# Patient Record
Sex: Female | Born: 1935
Health system: Southern US, Community
[De-identification: ages and names within clinical notes are randomized; demographics above are authoritative.]

## PROBLEM LIST (undated history)

## (undated) DIAGNOSIS — E7800 Pure hypercholesterolemia, unspecified: Secondary | ICD-10-CM

## (undated) DIAGNOSIS — R002 Palpitations: Secondary | ICD-10-CM

## (undated) DIAGNOSIS — I10 Essential (primary) hypertension: Secondary | ICD-10-CM

## (undated) DIAGNOSIS — M419 Scoliosis, unspecified: Secondary | ICD-10-CM

## (undated) HISTORY — DX: Palpitations: R00.2

## (undated) HISTORY — DX: Scoliosis, unspecified: M41.9

## (undated) HISTORY — DX: Pure hypercholesterolemia, unspecified: E78.00

## (undated) HISTORY — DX: Essential (primary) hypertension: I10

---

## 1997-12-29 ENCOUNTER — Ambulatory Visit (HOSPITAL_COMMUNITY): Admission: RE | Admit: 1997-12-29 | Discharge: 1997-12-29 | Payer: Self-pay | Admitting: Obstetrics and Gynecology

## 1999-03-06 ENCOUNTER — Other Ambulatory Visit: Admission: RE | Admit: 1999-03-06 | Discharge: 1999-03-06 | Payer: Self-pay | Admitting: Obstetrics and Gynecology

## 1999-12-30 ENCOUNTER — Ambulatory Visit (HOSPITAL_COMMUNITY): Admission: RE | Admit: 1999-12-30 | Discharge: 1999-12-30 | Payer: Self-pay | Admitting: Gastroenterology

## 2000-01-15 ENCOUNTER — Encounter: Admission: RE | Admit: 2000-01-15 | Discharge: 2000-01-15 | Payer: Self-pay | Admitting: Obstetrics and Gynecology

## 2000-01-15 ENCOUNTER — Encounter: Payer: Self-pay | Admitting: Obstetrics and Gynecology

## 2000-05-04 ENCOUNTER — Other Ambulatory Visit: Admission: RE | Admit: 2000-05-04 | Discharge: 2000-05-04 | Payer: Self-pay | Admitting: Obstetrics and Gynecology

## 2001-01-18 ENCOUNTER — Encounter: Admission: RE | Admit: 2001-01-18 | Discharge: 2001-01-18 | Payer: Self-pay | Admitting: Cardiology

## 2001-01-18 ENCOUNTER — Encounter: Payer: Self-pay | Admitting: Cardiology

## 2001-09-23 ENCOUNTER — Encounter: Admission: RE | Admit: 2001-09-23 | Discharge: 2001-09-23 | Payer: Self-pay | Admitting: Gastroenterology

## 2001-09-23 ENCOUNTER — Encounter: Payer: Self-pay | Admitting: Gastroenterology

## 2002-01-25 ENCOUNTER — Encounter: Payer: Self-pay | Admitting: Obstetrics and Gynecology

## 2002-01-25 ENCOUNTER — Encounter: Admission: RE | Admit: 2002-01-25 | Discharge: 2002-01-25 | Payer: Self-pay | Admitting: Obstetrics and Gynecology

## 2003-02-08 ENCOUNTER — Encounter: Admission: RE | Admit: 2003-02-08 | Discharge: 2003-02-08 | Payer: Self-pay | Admitting: Obstetrics and Gynecology

## 2003-02-08 ENCOUNTER — Encounter: Payer: Self-pay | Admitting: Obstetrics and Gynecology

## 2004-03-01 ENCOUNTER — Ambulatory Visit (HOSPITAL_COMMUNITY): Admission: RE | Admit: 2004-03-01 | Discharge: 2004-03-01 | Payer: Self-pay | Admitting: Obstetrics and Gynecology

## 2005-03-13 ENCOUNTER — Ambulatory Visit (HOSPITAL_COMMUNITY): Admission: RE | Admit: 2005-03-13 | Discharge: 2005-03-13 | Payer: Self-pay | Admitting: Obstetrics and Gynecology

## 2005-11-10 HISTORY — PX: US ECHOCARDIOGRAPHY: HXRAD669

## 2006-03-18 ENCOUNTER — Ambulatory Visit (HOSPITAL_COMMUNITY): Admission: RE | Admit: 2006-03-18 | Discharge: 2006-03-18 | Payer: Self-pay | Admitting: Cardiology

## 2007-03-24 ENCOUNTER — Ambulatory Visit (HOSPITAL_COMMUNITY): Admission: RE | Admit: 2007-03-24 | Discharge: 2007-03-24 | Payer: Self-pay | Admitting: Cardiology

## 2008-03-30 ENCOUNTER — Ambulatory Visit (HOSPITAL_COMMUNITY): Admission: RE | Admit: 2008-03-30 | Discharge: 2008-03-30 | Payer: Self-pay | Admitting: Cardiology

## 2009-04-03 ENCOUNTER — Ambulatory Visit (HOSPITAL_COMMUNITY): Admission: RE | Admit: 2009-04-03 | Discharge: 2009-04-03 | Payer: Self-pay | Admitting: Cardiology

## 2010-01-22 ENCOUNTER — Ambulatory Visit: Payer: Self-pay | Admitting: Cardiology

## 2010-04-30 ENCOUNTER — Ambulatory Visit (HOSPITAL_COMMUNITY): Admission: RE | Admit: 2010-04-30 | Discharge: 2010-04-30 | Payer: Self-pay | Admitting: Cardiology

## 2010-07-29 ENCOUNTER — Ambulatory Visit (INDEPENDENT_AMBULATORY_CARE_PROVIDER_SITE_OTHER): Payer: Medicare Other | Admitting: Cardiology

## 2010-07-29 DIAGNOSIS — R002 Palpitations: Secondary | ICD-10-CM

## 2010-07-29 DIAGNOSIS — E7800 Pure hypercholesterolemia, unspecified: Secondary | ICD-10-CM

## 2010-07-29 DIAGNOSIS — I119 Hypertensive heart disease without heart failure: Secondary | ICD-10-CM

## 2010-11-08 NOTE — Procedures (Signed)
[place]  Patient:    Catherine Frost, Catherine Frost                       MRN: [id number] Proc. Date: 12/30/99 Adm. Date:  [id number] Attending:  Rich Brave CC:         Clovis Pu. Patty Sermons, M.D.                           Procedure Report  PROCEDURE:  Colonoscopy.  INDICATIONS:  A 77 year old female for colon cancer screening.  FINDINGS:  Normal exam to the terminal ileum.  DESCRIPTION OF PROCEDURE:  The nature, purpose and risks of the procedure had been discussed with the patient who provided written consent.  Sedation was fentanyl 70 mcg and Versed 7 IV with arrhythmias or desaturation.  The Olympus PCF 140L pediatric extended length video colonoscope was advanced without much difficulty to the terminal ileum which had a normal appearance and pullback was then performed.  The quality of the prep was excellent and it is felt that all areas were well seen.  This was a normal examination.  No polyps, cancer, colitis, vascular malformations or diverticular disease were observed.  Retroflexion in the rectum showed some hypertrophied anal papillae.  No biopsies were obtained. The patient tolerated the procedure well and there were no apparent complications.  IMPRESSION:  Normal screening colonoscopy.  PLAN:  Consider a screening flexible sigmoidoscopy in five years with perhaps a repeat screening colonoscopy in ten years if the patient remains in good general medical health.  Annual Hemoccult testing probably not necessary in view of the fact she had a negative colonoscopy. DD:  12/30/99 TD:  12/30/99 Job: [id number]

## 2011-01-29 ENCOUNTER — Encounter: Payer: Self-pay | Admitting: Cardiology

## 2011-02-03 ENCOUNTER — Ambulatory Visit (INDEPENDENT_AMBULATORY_CARE_PROVIDER_SITE_OTHER): Payer: Medicare Other | Admitting: *Deleted

## 2011-02-03 DIAGNOSIS — E7800 Pure hypercholesterolemia, unspecified: Secondary | ICD-10-CM

## 2011-02-03 LAB — HEPATIC FUNCTION PANEL
ALT: 19 U/L (ref 0–35)
AST: 35 U/L (ref 0–37)
Alkaline Phosphatase: 63 U/L (ref 39–117)
Total Bilirubin: 0.4 mg/dL (ref 0.3–1.2)
Total Protein: 7 g/dL (ref 6.0–8.3)

## 2011-02-03 LAB — LIPID PANEL
Cholesterol: 207 mg/dL — ABNORMAL HIGH (ref 0–200)
HDL: 79.1 mg/dL (ref >39.00)
Total CHOL/HDL Ratio: 3
VLDL: 13.8 mg/dL (ref 0.0–40.0)

## 2011-02-03 LAB — BASIC METABOLIC PANEL
Chloride: 103 mEq/L (ref 96–112)
GFR: 74.25 mL/min (ref >60.00)
Glucose, Bld: 90 mg/dL (ref 70–99)

## 2011-02-06 ENCOUNTER — Ambulatory Visit (INDEPENDENT_AMBULATORY_CARE_PROVIDER_SITE_OTHER): Payer: Medicare Other | Admitting: Cardiology

## 2011-02-06 ENCOUNTER — Encounter: Payer: Self-pay | Admitting: Cardiology

## 2011-02-06 VITALS — BP 140/85 | HR 74 | Wt 142.0 lb

## 2011-02-06 DIAGNOSIS — E7800 Pure hypercholesterolemia, unspecified: Secondary | ICD-10-CM

## 2011-02-06 DIAGNOSIS — M4120 Other idiopathic scoliosis, site unspecified: Secondary | ICD-10-CM

## 2011-02-06 DIAGNOSIS — R002 Palpitations: Secondary | ICD-10-CM

## 2011-02-06 DIAGNOSIS — M419 Scoliosis, unspecified: Secondary | ICD-10-CM | POA: Insufficient documentation

## 2011-02-06 DIAGNOSIS — I119 Hypertensive heart disease without heart failure: Secondary | ICD-10-CM

## 2011-02-06 DIAGNOSIS — E785 Hyperlipidemia, unspecified: Secondary | ICD-10-CM

## 2011-02-06 NOTE — Progress Notes (Signed)
Catherine Frost Date of Birth:  04-11-1928 [facility] / [facility] [address]   [address] [place], [place]  [address] [phone]           Fax   [phone]  History of Present Illness: This pleasant 77 year old woman is seen for a scheduled followup office visit.  She has a past history of essential hypertension.  She also has a history of hypercholesterolemia.  She has a history of severe scoliosis.  Since last visit she has been feeling well.  She's been experiencing any chest pain or shortness of breath.  Last week she was walking in her neighborhood and got stung by an insect behind her right knee.  She applied some steroid cream to the bite mark and after that had some transient chest tightness which resolved when she took an antihistamine.  She's had occasional brief palpitations but no sustained arrhythmia.  She's not having any exertional chest pain to suggest angina pectoris.  Current Outpatient Prescriptions  Medication Sig Dispense Refill  . aspirin 81 MG tablet Take 81 mg by mouth daily.        . metoprolol (LOPRESSOR) 50 MG tablet Take 50 mg by mouth. Takes 1/2 Tab Daily       . Multiple Vitamins-Minerals (OCUVITE PO) Take by mouth daily.        . Thiamine HCl (VITAMIN B-1) 50 MG tablet Take 50 mg by mouth daily.          Allergies  Allergen Reactions  . Codeine   . Lipitor (Atorvastatin Calcium)     Fatigue  . Novocain     There is no problem list on file for this patient.   History  Smoking status  . Never Smoker   Smokeless tobacco  . Not on file    History  Alcohol Use No    Family History  Problem Relation Age of Onset  . Heart disease Mother   . Coronary artery disease Mother   . Parkinsonism Father     Review of Systems: Constitutional: no fever chills diaphoresis or fatigue or change in weight.  Head and neck: no hearing loss, no epistaxis, no photophobia or visual disturbance. Respiratory: No cough, shortness of breath or  wheezing. Cardiovascular: No chest pain peripheral edema, palpitations. Gastrointestinal: No abdominal distention, no abdominal pain, no change in bowel habits hematochezia or melena. Genitourinary: No dysuria, no frequency, no urgency, no nocturia. Musculoskeletal:No arthralgias, no back pain, no gait disturbance or myalgias. Neurological: No dizziness, no headaches, no numbness, no seizures, no syncope, no weakness, no tremors. Hematologic: No lymphadenopathy, no easy bruising. Psychiatric: No confusion, no hallucinations, no sleep disturbance.    Physical Exam: Filed Vitals:   02/06/11 1419  BP: 140/85  Pulse: 74  The general appearance reveals a well-developed well-nourished woman in no distress.Pupils equal and reactive.   Extraocular Movements are full.  There is no scleral icterus.  The mouth and pharynx are normal.  The neck is supple.  The carotids reveal no bruits.  The jugular venous pressure is normal.  The thyroid is not enlarged.  There is no lymphadenopathy.  The chest is clear to percussion and auscultation. There are no rales or rhonchi. Expansion of the chest is symmetrical.    Spine reveals severe scoliosis.The precordium is quiet.  The first heart sound is normal.  The second heart sound is physiologically split.  There is no murmur gallop rub or click.  There is no abnormal lift or heave.  The abdomen is soft and nontender. Bowel sounds are normal. The liver and spleen are not enlarged. There Are no abdominal masses. There are no bruits.  The pedal pulses are good.  There is no phlebitis or edema.  There is no cyanosis or clubbing.  Strength is normal and symmetrical in all extremities.  There is no lateralizing weakness.  There are no sensory deficits.  The skin is warm and dry.  There is no rash.     Assessment / Plan:  Continue same medication.  Work harder on weight loss and strict diet.  Recheck in 6 months for followup office visit and fasting lab  work

## 2011-03-26 ENCOUNTER — Other Ambulatory Visit: Payer: Self-pay | Admitting: Cardiology

## 2011-03-26 DIAGNOSIS — Z1231 Encounter for screening mammogram for malignant neoplasm of breast: Secondary | ICD-10-CM

## 2011-05-02 ENCOUNTER — Ambulatory Visit (HOSPITAL_COMMUNITY)
Admission: RE | Admit: 2011-05-02 | Discharge: 2011-05-02 | Disposition: A | Discharge status: Home / Self Care | Payer: Medicare Other | Source: Ambulatory Visit | Attending: Cardiology | Admitting: Cardiology

## 2011-05-02 DIAGNOSIS — Z1231 Encounter for screening mammogram for malignant neoplasm of breast: Secondary | ICD-10-CM

## 2011-08-01 ENCOUNTER — Other Ambulatory Visit (INDEPENDENT_AMBULATORY_CARE_PROVIDER_SITE_OTHER): Payer: Medicare Other | Admitting: *Deleted

## 2011-08-01 DIAGNOSIS — E785 Hyperlipidemia, unspecified: Secondary | ICD-10-CM | POA: Diagnosis not present

## 2011-08-01 LAB — BASIC METABOLIC PANEL
CO2: 27 mEq/L (ref 19–32)
Creatinine, Ser: 0.8 mg/dL (ref 0.4–1.2)
Glucose, Bld: 88 mg/dL (ref 70–99)
Sodium: 139 mEq/L (ref 135–145)

## 2011-08-01 LAB — HEPATIC FUNCTION PANEL
ALT: 24 U/L (ref 0–35)
Alkaline Phosphatase: 66 U/L (ref 39–117)
Total Protein: 6.8 g/dL (ref 6.0–8.3)

## 2011-08-01 LAB — LIPID PANEL: LDL Cholesterol: 109 mg/dL — ABNORMAL HIGH (ref 0–99)

## 2011-08-01 NOTE — Progress Notes (Signed)
Quick Note:  Please make copy of labs for patient visit. ______ 

## 2011-08-07 ENCOUNTER — Ambulatory Visit (INDEPENDENT_AMBULATORY_CARE_PROVIDER_SITE_OTHER): Payer: Medicare Other | Admitting: Cardiology

## 2011-08-07 ENCOUNTER — Encounter: Payer: Self-pay | Admitting: Cardiology

## 2011-08-07 VITALS — BP 130/80 | HR 80 | Ht 60.0 in | Wt 142.0 lb

## 2011-08-07 DIAGNOSIS — M4120 Other idiopathic scoliosis, site unspecified: Secondary | ICD-10-CM

## 2011-08-07 DIAGNOSIS — E7800 Pure hypercholesterolemia, unspecified: Secondary | ICD-10-CM | POA: Diagnosis not present

## 2011-08-07 DIAGNOSIS — R002 Palpitations: Secondary | ICD-10-CM

## 2011-08-07 DIAGNOSIS — I119 Hypertensive heart disease without heart failure: Secondary | ICD-10-CM | POA: Diagnosis not present

## 2011-08-07 DIAGNOSIS — M419 Scoliosis, unspecified: Secondary | ICD-10-CM

## 2011-08-07 NOTE — Progress Notes (Signed)
Catherine Frost Date of Birth:  [birth year] [facility] [address] [place], [place]  [address] [phone]  Fax   [phone]  HPI: This pleasant 78 year old woman is seen for a scheduled 6 month followup office visit.  She has a history of essential hypertension and history of hypercholesterolemia.  She also has severe scoliosis.  She has not had any symptoms to suggest ischemic heart disease or angina pectoris.  Current Outpatient Prescriptions  Medication Sig Dispense Refill  . aspirin 81 MG tablet Take 81 mg by mouth daily.        . metoprolol (LOPRESSOR) 50 MG tablet Take 50 mg by mouth. Takes 1/2 Tab Daily       . Multiple Vitamins-Minerals (OCUVITE PO) Take by mouth daily.        . Thiamine HCl (VITAMIN B-1) 50 MG tablet Take 50 mg by mouth daily.          Allergies  Allergen Reactions  . Codeine   . Lipitor (Atorvastatin Calcium)     Fatigue  . Novocain     Patient Active Problem List  Diagnoses  . Hypercholesterolemia  . Palpitations  . Benign hypertensive heart disease without heart failure  . Scoliosis    History  Smoking status  . Never Smoker   Smokeless tobacco  . Not on file    History  Alcohol Use No    Family History  Problem Relation Age of Onset  . Heart disease Mother   . Coronary artery disease Mother   . Parkinsonism Father     Review of Systems: The patient denies any heat or cold intolerance.  No weight gain or weight loss.  The patient denies headaches or blurry vision.  There is no cough or sputum production.  The patient denies dizziness.  There is no hematuria or hematochezia.  The patient denies any muscle aches or arthritis.  The patient denies any rash.  The patient denies frequent falling or instability.  There is no history of depression or anxiety.  All other systems were reviewed and are negative.   Physical Exam: Filed Vitals:   08/07/11 1412  BP: 130/80  Pulse: 80   the general appearance  reveals a well-developed well-nourished woman in no distress.  He does have a significant scoliosis.Pupils equal and reactive.   Extraocular Movements are full.  There is no scleral icterus.  The mouth and pharynx are normal.  The neck is supple.  The carotids reveal no bruits.  The jugular venous pressure is normal.  The thyroid is not enlarged.  There is no lymphadenopathy.  The chest is clear to percussion and auscultation. There are no rales or rhonchi. Expansion of the chest is symmetrical.  The precordium is quiet.  The first heart sound is normal.  The second heart sound is physiologically split.  There is no murmur gallop rub or click.  There is no abnormal lift or heave.  The abdomen is soft and nontender. Bowel sounds are normal. The liver and spleen are not enlarged. There Are no abdominal masses. There are no bruits.  The pedal pulses are good.  There is no phlebitis or edema.  There is no cyanosis or clubbing. Strength is normal and symmetrical in all extremities.  There is no lateralizing weakness.  There are no sensory deficits.      Assessment / Plan:  She is to continue same medication.  Her blood work this time was quite satisfactory.  Recheck in 6  months for followup office visit and fasting lab work.  Continue disciplined exercise program.

## 2011-08-07 NOTE — Patient Instructions (Signed)
Your physician recommends that you continue on your current medications as directed. Please refer to the Current Medication list given to you today.  Your physician wants you to follow-up in: 6 months. You will receive a reminder letter in the mail two months in advance. If you don't receive a letter, please call our office to schedule the follow-up appointment.  

## 2011-08-07 NOTE — Assessment & Plan Note (Signed)
She has not been aware of any recent palpitations or skipping of her heart.  No chest pain or shortness of breath.

## 2011-08-07 NOTE — Assessment & Plan Note (Signed)
The patient has started a regular exercise program.  She doesn't hour of exercise in her home each day.  Half of it is treadmill and half visit as a stationary bike.  She has felt much better since she started the exercise program.

## 2011-08-07 NOTE — Assessment & Plan Note (Signed)
Patient has a history of essential hypertension which has responded to metoprolol.  She had a recent colonoscopy which was uneventful.

## 2011-10-09 ENCOUNTER — Other Ambulatory Visit: Payer: Self-pay | Admitting: Cardiology

## 2011-10-10 NOTE — Telephone Encounter (Signed)
Refilled metoprolol 

## 2011-11-28 DIAGNOSIS — H52209 Unspecified astigmatism, unspecified eye: Secondary | ICD-10-CM | POA: Diagnosis not present

## 2011-11-28 DIAGNOSIS — H01009 Unspecified blepharitis unspecified eye, unspecified eyelid: Secondary | ICD-10-CM | POA: Diagnosis not present

## 2011-11-28 DIAGNOSIS — H2510 Age-related nuclear cataract, unspecified eye: Secondary | ICD-10-CM | POA: Diagnosis not present

## 2011-11-28 DIAGNOSIS — H43819 Vitreous degeneration, unspecified eye: Secondary | ICD-10-CM | POA: Diagnosis not present

## 2012-03-08 ENCOUNTER — Telehealth: Payer: Self-pay | Admitting: Cardiology

## 2012-03-08 DIAGNOSIS — E7800 Pure hypercholesterolemia, unspecified: Secondary | ICD-10-CM

## 2012-03-08 NOTE — Telephone Encounter (Signed)
F/U  Calling  Back to R.S appt, need to be set up for lab work prior to appt on  10/5

## 2012-03-08 NOTE — Telephone Encounter (Signed)
Left message to call back   [facility] for labs (bmet,hfp,lp) to be done prior to visit, will schedule when calls back

## 2012-03-08 NOTE — Telephone Encounter (Signed)
plz return call to patient at hm# regarding lab order before 11/5 appnt.

## 2012-03-20 DIAGNOSIS — Z23 Encounter for immunization: Secondary | ICD-10-CM | POA: Diagnosis not present

## 2012-03-23 ENCOUNTER — Other Ambulatory Visit: Payer: Self-pay | Admitting: *Deleted

## 2012-03-23 DIAGNOSIS — I119 Hypertensive heart disease without heart failure: Secondary | ICD-10-CM

## 2012-03-23 DIAGNOSIS — E7800 Pure hypercholesterolemia, unspecified: Secondary | ICD-10-CM

## 2012-03-23 DIAGNOSIS — R002 Palpitations: Secondary | ICD-10-CM

## 2012-03-24 ENCOUNTER — Other Ambulatory Visit (INDEPENDENT_AMBULATORY_CARE_PROVIDER_SITE_OTHER): Payer: Medicare Other

## 2012-03-24 DIAGNOSIS — R002 Palpitations: Secondary | ICD-10-CM

## 2012-03-24 DIAGNOSIS — E7800 Pure hypercholesterolemia, unspecified: Secondary | ICD-10-CM

## 2012-03-24 DIAGNOSIS — I119 Hypertensive heart disease without heart failure: Secondary | ICD-10-CM

## 2012-03-24 LAB — LIPID PANEL
Total CHOL/HDL Ratio: 3
Triglycerides: 76 mg/dL (ref 0.0–149.0)
VLDL: 15.2 mg/dL (ref 0.0–40.0)

## 2012-03-24 LAB — HEPATIC FUNCTION PANEL
AST: 36 U/L (ref 0–37)
Albumin: 4.1 g/dL (ref 3.5–5.2)
Alkaline Phosphatase: 66 U/L (ref 39–117)
Bilirubin, Direct: 0.1 mg/dL (ref 0.0–0.3)
Total Bilirubin: 0.7 mg/dL (ref 0.3–1.2)
Total Protein: 7.2 g/dL (ref 6.0–8.3)

## 2012-03-24 LAB — BASIC METABOLIC PANEL
Creatinine, Ser: 0.8 mg/dL (ref 0.4–1.2)
GFR: 71.93 mL/min (ref >60.00)
Glucose, Bld: 100 mg/dL — ABNORMAL HIGH (ref 70–99)
Potassium: 4.4 mEq/L (ref 3.5–5.1)
Sodium: 141 mEq/L (ref 135–145)

## 2012-03-24 NOTE — Progress Notes (Signed)
Quick Note:  Please make copy of labs for patient visit. ______ 

## 2012-03-25 ENCOUNTER — Encounter: Payer: Self-pay | Admitting: Cardiology

## 2012-03-25 ENCOUNTER — Ambulatory Visit (INDEPENDENT_AMBULATORY_CARE_PROVIDER_SITE_OTHER): Payer: Medicare Other | Admitting: Cardiology

## 2012-03-25 VITALS — BP 156/84 | HR 73 | Ht 60.0 in | Wt 136.1 lb

## 2012-03-25 DIAGNOSIS — R002 Palpitations: Secondary | ICD-10-CM

## 2012-03-25 DIAGNOSIS — I119 Hypertensive heart disease without heart failure: Secondary | ICD-10-CM

## 2012-03-25 DIAGNOSIS — E7800 Pure hypercholesterolemia, unspecified: Secondary | ICD-10-CM

## 2012-03-25 DIAGNOSIS — M419 Scoliosis, unspecified: Secondary | ICD-10-CM

## 2012-03-25 DIAGNOSIS — M4120 Other idiopathic scoliosis, site unspecified: Secondary | ICD-10-CM

## 2012-03-25 NOTE — Patient Instructions (Addendum)
Continue same medications   Your physician wants you to follow-up in: 6 months with fasting lab work. You will receive a reminder letter in the mail two months in advance. If you don't receive a letter, please call our office to schedule the follow-up appointment.

## 2012-03-25 NOTE — Progress Notes (Signed)
Catherine Frost Date of Birth:  [birth year] Catherine Frost [facility] [address] [place], [place]  [address] [phone]  Fax   [phone]  HPI: This pleasant 78 year old woman is seen for a scheduled 6 month followup office visit. She has a history of essential hypertension and history of hypercholesterolemia. She also has severe scoliosis. She has not had any symptoms to suggest ischemic heart disease or angina pectoris.  Since her last saw her her husband was found to have metastatic cancer and died about one month ago.  The patient has been more tense and anxious during the grief process and has adversely affected her blood pressure.  Current Outpatient Prescriptions  Medication Sig Dispense Refill  . aspirin 81 MG tablet Take 81 mg by mouth daily.        . metoprolol (LOPRESSOR) 50 MG tablet TAKE 1/2 A TABLET BY MOUTH ONCE DAILY  90 tablet  PRN  . Thiamine HCl (VITAMIN B-1) 50 MG tablet Take 50 mg by mouth daily.          Allergies  Allergen Reactions  . Codeine   . Lipitor (Atorvastatin Calcium)     Fatigue  . Procaine Hcl     Patient Active Problem List  Diagnosis  . Hypercholesterolemia  . Palpitations  . Benign hypertensive heart disease without heart failure  . Scoliosis    History  Smoking status  . Never Smoker   Smokeless tobacco  . Not on file    History  Alcohol Use No    Family History  Problem Relation Age of Onset  . Heart disease Mother   . Coronary artery disease Mother   . Parkinsonism Father     Review of Systems: The patient denies any heat or cold intolerance.  No weight gain or weight loss.  The patient denies headaches or blurry vision.  There is no cough or sputum production.  The patient denies dizziness.  There is no hematuria or hematochezia.  The patient denies any muscle aches or arthritis.  The patient denies any rash.  The patient denies frequent falling or instability.  There is no history of depression or anxiety.   All other systems were reviewed and are negative.   Physical Exam: Filed Vitals:   03/25/12 1509  BP: 156/84  Pulse:    the general appearance reveals a well-developed woman in no distress.  He is very marked spinal and torso deformity because of her scoliosis.Pupils equal and reactive.   Extraocular Movements are full.  There is no scleral icterus.  The mouth and pharynx are normal.  The neck is supple.  The carotids reveal no bruits.  The jugular venous pressure is normal.  The thyroid is not enlarged.  There is no lymphadenopathy.  The chest is clear to percussion and auscultation. There are no rales or rhonchi. Expansion of the chest is symmetrical.  The precordium is quiet.  The first heart sound is normal.  The second heart sound is physiologically split.  There is no murmur gallop rub or click.  There is no abnormal lift or heave.  The abdomen is soft and nontender. Bowel sounds are normal. The liver and spleen are not enlarged. There Are no abdominal masses. There are no bruits.  The pedal pulses are good.  There is no phlebitis or edema.  There is no cyanosis or clubbing. Strength is normal and symmetrical in all extremities.  There is no lateralizing weakness.  There are no sensory deficits.  The  skin is warm and dry.  There is no rash.     Assessment / Plan: Continue same medication.  Her blood pressure is high today but should improve with more time following her husband's death.  Her husband was a retired Government social research officer here in [place].   Return here in 6 months for followup office visit lipid panel hepatic function panel and basal metabolic panel

## 2012-03-25 NOTE — Assessment & Plan Note (Signed)
The patient has had severe scoliosis since being a young girl.  Her spine is so crooked that her rib cage tenderness to want to crowd out her pelvic bone.  She is asking for some type of a back brace which might help her.  I told her that I would like her to see an orthopedist with experience with scoliosis and we will have her call Dr. Darrelyn Hillock.  I also filled out a form for her to have handicapped parking

## 2012-03-25 NOTE — Assessment & Plan Note (Signed)
The patient has not been experiencing any severe palpitations.  She remains on metoprolol 25 mg daily

## 2012-03-25 NOTE — Assessment & Plan Note (Signed)
While her husband was ill in the hospital the patient's diet was more chaotic and she has not been on her careful diet.Marland Kitchen despite this, her lipid values are not much changed from previously.  The patient has lost 4 pounds since we last saw her.

## 2012-04-15 ENCOUNTER — Other Ambulatory Visit: Payer: Self-pay | Admitting: Cardiology

## 2012-04-15 DIAGNOSIS — Z1231 Encounter for screening mammogram for malignant neoplasm of breast: Secondary | ICD-10-CM

## 2012-04-27 ENCOUNTER — Ambulatory Visit: Payer: Medicare Other | Admitting: Cardiology

## 2012-05-04 ENCOUNTER — Ambulatory Visit (HOSPITAL_COMMUNITY)
Admission: RE | Admit: 2012-05-04 | Discharge: 2012-05-04 | Disposition: A | Discharge status: Home / Self Care | Payer: Medicare Other | Source: Ambulatory Visit | Attending: Cardiology | Admitting: Cardiology

## 2012-05-04 DIAGNOSIS — Z1231 Encounter for screening mammogram for malignant neoplasm of breast: Secondary | ICD-10-CM | POA: Insufficient documentation

## 2012-05-25 DIAGNOSIS — M4120 Other idiopathic scoliosis, site unspecified: Secondary | ICD-10-CM | POA: Diagnosis not present

## 2012-06-01 DIAGNOSIS — M4120 Other idiopathic scoliosis, site unspecified: Secondary | ICD-10-CM | POA: Diagnosis not present

## 2012-06-07 DIAGNOSIS — M4120 Other idiopathic scoliosis, site unspecified: Secondary | ICD-10-CM | POA: Diagnosis not present

## 2012-10-20 ENCOUNTER — Other Ambulatory Visit: Payer: Medicare Other

## 2012-10-22 ENCOUNTER — Ambulatory Visit: Payer: Medicare Other | Admitting: Cardiology

## 2012-11-08 DIAGNOSIS — Z0000 Encounter for general adult medical examination without abnormal findings: Secondary | ICD-10-CM | POA: Diagnosis not present

## 2012-11-11 ENCOUNTER — Other Ambulatory Visit (INDEPENDENT_AMBULATORY_CARE_PROVIDER_SITE_OTHER): Payer: Medicare Other

## 2012-11-11 DIAGNOSIS — E7800 Pure hypercholesterolemia, unspecified: Secondary | ICD-10-CM | POA: Diagnosis not present

## 2012-11-11 DIAGNOSIS — I119 Hypertensive heart disease without heart failure: Secondary | ICD-10-CM | POA: Diagnosis not present

## 2012-11-11 LAB — LIPID PANEL
HDL: 77.7 mg/dL (ref >39.00)
LDL Cholesterol: 102 mg/dL — ABNORMAL HIGH (ref 0–99)
Total CHOL/HDL Ratio: 2
Triglycerides: 62 mg/dL (ref 0.0–149.0)
VLDL: 12.4 mg/dL (ref 0.0–40.0)

## 2012-11-11 LAB — BASIC METABOLIC PANEL
Creatinine, Ser: 1 mg/dL (ref 0.4–1.2)
Glucose, Bld: 81 mg/dL (ref 70–99)

## 2012-11-11 LAB — HEPATIC FUNCTION PANEL
AST: 38 U/L — ABNORMAL HIGH (ref 0–37)
Albumin: 4.1 g/dL (ref 3.5–5.2)
Total Protein: 7.1 g/dL (ref 6.0–8.3)

## 2012-11-14 NOTE — Progress Notes (Signed)
Quick Note:  Please make copy of labs for patient visit. ______ 

## 2012-11-18 ENCOUNTER — Encounter: Payer: Self-pay | Admitting: Cardiology

## 2012-11-18 ENCOUNTER — Ambulatory Visit (INDEPENDENT_AMBULATORY_CARE_PROVIDER_SITE_OTHER): Payer: Medicare Other | Admitting: Cardiology

## 2012-11-18 VITALS — BP 152/80 | HR 80 | Ht 60.0 in | Wt 121.4 lb

## 2012-11-18 DIAGNOSIS — I119 Hypertensive heart disease without heart failure: Secondary | ICD-10-CM

## 2012-11-18 DIAGNOSIS — E7800 Pure hypercholesterolemia, unspecified: Secondary | ICD-10-CM

## 2012-11-18 DIAGNOSIS — R002 Palpitations: Secondary | ICD-10-CM

## 2012-11-18 DIAGNOSIS — M4120 Other idiopathic scoliosis, site unspecified: Secondary | ICD-10-CM | POA: Diagnosis not present

## 2012-11-18 DIAGNOSIS — M419 Scoliosis, unspecified: Secondary | ICD-10-CM

## 2012-11-18 NOTE — Assessment & Plan Note (Signed)
We reviewed her recent labs which are satisfactory.  The patient continues to watch her diet carefully and she tries to get plenty of regular exercise by doing yard work.

## 2012-11-18 NOTE — Patient Instructions (Addendum)
Your physician recommends that you continue on your current medications as directed. Please refer to the Current Medication list given to you today.   Your physician wants you to follow-up in: 6 months with Dr. Patty Sermons.  You will receive a reminder letter in the mail two months in advance. If you don't receive a letter, please call our office at [phone] to schedule the follow-up appointment.   Your physician recommends that you return for lab work in: 6 momths with office visit for lp/hfp/bmet

## 2012-11-18 NOTE — Assessment & Plan Note (Signed)
The patient has not been experiencing any recent palpitations or tachycardia.  No exertional chest pain or angina.  No symptoms of CHF.

## 2012-11-18 NOTE — Progress Notes (Signed)
Catherine Frost Date of Birth:  08/26/1927 [facility] [address] [place], [place]  [address] [phone]  Fax   [phone]  HPI: This pleasant 79 year old woman is seen for a scheduled 6 month followup office visit. She has a history of essential hypertension and history of hypercholesterolemia. She also has severe scoliosis. She has not had any symptoms to suggest ischemic heart disease or angina pectoris. Since her last saw her her husband was found to have metastatic cancer and died about 6 months ago.  The patient appears to be having a normal grieving and adjustment process.  Fortunately she has a  Attentive daughter who lives in [place] and helps her a lot .  Current Outpatient Prescriptions  Medication Sig Dispense Refill  . aspirin 81 MG tablet Take 81 mg by mouth daily.        . metoprolol (LOPRESSOR) 50 MG tablet TAKE 1/2 A TABLET BY MOUTH ONCE DAILY  90 tablet  PRN  . Thiamine HCl (VITAMIN B-1) 50 MG tablet Take 50 mg by mouth daily.         No current facility-administered medications for this visit.    Allergies  Allergen Reactions  . Codeine   . Dipth, Acell Pertus, (Tetanus-Diphth-Acell Pertussis)     Allergic to diptheria  . Lipitor (Atorvastatin Calcium)     Fatigue  . Procaine Hcl     Patient Active Problem List   Diagnosis Date Noted  . Hypercholesterolemia 02/06/2011  . Palpitations 02/06/2011  . Benign hypertensive heart disease without heart failure 02/06/2011  . Scoliosis 02/06/2011    History  Smoking status  . Never Smoker   Smokeless tobacco  . Not on file    History  Alcohol Use No    Family History  Problem Relation Age of Onset  . Heart disease Mother   . Coronary artery disease Mother   . Parkinsonism Father     Review of Systems: The patient denies any heat or cold intolerance.  No weight gain or weight loss.  The patient denies headaches or blurry vision.  There is no cough or sputum production.   The patient denies dizziness.  There is no hematuria or hematochezia.  The patient denies any muscle aches or arthritis.  The patient denies any rash.  The patient denies frequent falling or instability.  There is no history of depression or anxiety.  All other systems were reviewed and are negative.   Physical Exam: Filed Vitals:   11/18/12 1407  BP: 152/80  Pulse: 80   the general appearance reveals a well-developed well-nourished woman in no distress.  She has marked scoliosis.The head and neck exam reveals pupils equal and reactive.  Extraocular movements are full.  There is no scleral icterus.  The mouth and pharynx are normal.  The neck is supple.  The carotids reveal no bruits.  The jugular venous pressure is normal.  The  thyroid is not enlarged.  There is no lymphadenopathy.  The chest is clear to percussion and auscultation.  There are no rales or rhonchi.  Expansion of the chest is symmetrical.  The precordium is quiet.  The first heart sound is normal.  The second heart sound is physiologically split.  There is no murmur gallop rub or click.  There is no abnormal lift or heave.  The abdomen is soft and nontender.  The bowel sounds are normal.  The liver and spleen are not enlarged.  There are no abdominal masses.  There are no abdominal bruits.  Extremities reveal good pedal pulses.  There is no phlebitis or edema.  There is no cyanosis or clubbing.  Strength is normal and symmetrical in all extremities.  There is no lateralizing weakness.  There are no sensory deficits.  The skin is warm and dry.  There is no rash.     Assessment / Plan: Continue same medication.  Recheck in 6 months for followup office visit lipid panel hepatic function panel and basal metabolic panel

## 2012-11-18 NOTE — Assessment & Plan Note (Signed)
Blood pressure has been remaining stable.  She checks it at home and it has a systolic usually in the 120s.  She is making a good effort to keep her mind sharp.  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

## 2012-11-24 ENCOUNTER — Telehealth: Payer: Self-pay | Admitting: Cardiology

## 2012-11-24 NOTE — Telephone Encounter (Signed)
New Problem  Pt wants to know if she can get a letter discussing her BP issues as well as her heart condition so she does not have to do jury duty.

## 2012-11-24 NOTE — Telephone Encounter (Signed)
Spoke with Catherine Frost who is requesting note from Dr. Patty Sermons exempting her from jury duty.  She states note she received regarding possible exclusions indicate Catherine Frost could be exempt if she had documentation of a disability and why it prevents her from serving. Catherine Frost reports her blood pressure goes up in times of stress and she is concerned jury duty would be very stressful and cause her blood pressure to be elevated. Also reports she is unable to sit for extended periods of time due to scoliosis. I told her I would send message to Dr. Patty Sermons to see if he could write this letter.

## 2012-11-24 NOTE — Telephone Encounter (Signed)
Left message to call back  

## 2012-11-25 ENCOUNTER — Encounter: Payer: Self-pay | Admitting: *Deleted

## 2012-11-25 NOTE — Telephone Encounter (Signed)
Okay to compose jury duty excuse letter

## 2012-11-25 NOTE — Telephone Encounter (Signed)
Letter done and will mail to patient.

## 2012-12-01 ENCOUNTER — Other Ambulatory Visit: Payer: Self-pay | Admitting: *Deleted

## 2012-12-01 MED ORDER — METOPROLOL TARTRATE 50 MG PO TABS: ORAL_TABLET | ORAL | Status: DC

## 2013-02-04 ENCOUNTER — Telehealth: Payer: Self-pay | Admitting: Cardiology

## 2013-02-04 DIAGNOSIS — E7800 Pure hypercholesterolemia, unspecified: Secondary | ICD-10-CM

## 2013-02-04 NOTE — Telephone Encounter (Signed)
New Problem   Pt request labs/// Does she need them

## 2013-02-04 NOTE — Telephone Encounter (Signed)
Left message to call back, needs lp/bmet/hfp (orders in epic)

## 2013-02-07 ENCOUNTER — Telehealth: Payer: Self-pay | Admitting: Cardiology

## 2013-02-07 NOTE — Telephone Encounter (Signed)
Scheduled appointment

## 2013-02-07 NOTE — Telephone Encounter (Signed)
Scheduled labs patient needed

## 2013-02-07 NOTE — Telephone Encounter (Signed)
Pt rtn call to [employer]

## 2013-03-11 DIAGNOSIS — Z23 Encounter for immunization: Secondary | ICD-10-CM | POA: Diagnosis not present

## 2013-03-28 ENCOUNTER — Other Ambulatory Visit: Payer: Self-pay | Admitting: Cardiology

## 2013-03-28 DIAGNOSIS — Z1231 Encounter for screening mammogram for malignant neoplasm of breast: Secondary | ICD-10-CM

## 2013-05-06 ENCOUNTER — Ambulatory Visit (HOSPITAL_COMMUNITY)
Admission: RE | Admit: 2013-05-06 | Discharge: 2013-05-06 | Disposition: A | Discharge status: Home / Self Care | Payer: Medicare Other | Source: Ambulatory Visit | Attending: Cardiology | Admitting: Cardiology

## 2013-05-06 DIAGNOSIS — Z1231 Encounter for screening mammogram for malignant neoplasm of breast: Secondary | ICD-10-CM | POA: Insufficient documentation

## 2013-05-11 ENCOUNTER — Other Ambulatory Visit (INDEPENDENT_AMBULATORY_CARE_PROVIDER_SITE_OTHER): Payer: Medicare Other

## 2013-05-11 DIAGNOSIS — E7800 Pure hypercholesterolemia, unspecified: Secondary | ICD-10-CM

## 2013-05-11 LAB — LIPID PANEL
HDL: 78 mg/dL (ref >39.00)
Total CHOL/HDL Ratio: 2
Triglycerides: 59 mg/dL (ref 0.0–149.0)
VLDL: 11.8 mg/dL (ref 0.0–40.0)

## 2013-05-11 LAB — BASIC METABOLIC PANEL
CO2: 27 mEq/L (ref 19–32)
GFR: 70.72 mL/min (ref >60.00)
Glucose, Bld: 87 mg/dL (ref 70–99)
Potassium: 4.1 mEq/L (ref 3.5–5.1)
Sodium: 137 mEq/L (ref 135–145)

## 2013-05-11 LAB — HEPATIC FUNCTION PANEL
Albumin: 3.9 g/dL (ref 3.5–5.2)
Total Bilirubin: 0.9 mg/dL (ref 0.3–1.2)

## 2013-05-11 NOTE — Progress Notes (Signed)
Quick Note:  Please make copy of labs for patient visit. ______ 

## 2013-05-17 ENCOUNTER — Ambulatory Visit (INDEPENDENT_AMBULATORY_CARE_PROVIDER_SITE_OTHER): Payer: Medicare Other | Admitting: Cardiology

## 2013-05-17 ENCOUNTER — Encounter: Payer: Self-pay | Admitting: Cardiology

## 2013-05-17 VITALS — BP 156/70 | HR 71 | Ht 60.0 in | Wt 115.4 lb

## 2013-05-17 DIAGNOSIS — M419 Scoliosis, unspecified: Secondary | ICD-10-CM

## 2013-05-17 DIAGNOSIS — E7800 Pure hypercholesterolemia, unspecified: Secondary | ICD-10-CM

## 2013-05-17 DIAGNOSIS — I119 Hypertensive heart disease without heart failure: Secondary | ICD-10-CM | POA: Diagnosis not present

## 2013-05-17 DIAGNOSIS — M4120 Other idiopathic scoliosis, site unspecified: Secondary | ICD-10-CM | POA: Diagnosis not present

## 2013-05-17 DIAGNOSIS — R002 Palpitations: Secondary | ICD-10-CM | POA: Diagnosis not present

## 2013-05-17 NOTE — Patient Instructions (Signed)
Your physician recommends that you continue on your current medications as directed. Please refer to the Current Medication list given to you today.  Your physician wants you to follow-up in: 6 months with fasting labs (lp/bmet/hfp)  You will receive a reminder letter in the mail two months in advance. If you don't receive a letter, please call our office to schedule the follow-up appointment.  

## 2013-05-17 NOTE — Assessment & Plan Note (Signed)
Blood pressure at home is in the range of 150 systolic max.  Blood pressure here in the office was higher today.  She will continue to monitor it carefully continue on a low-salt diet.  She is not having any headaches dizziness or syncope.  No chest pain.

## 2013-05-17 NOTE — Progress Notes (Signed)
[name] Date of Birth:  06/30/1927 [address] [place], [place]  [address] [phone]  Fax   [phone]  HPI: This pleasant 79 year old woman is seen for a scheduled 6 month followup office visit. She has a history of essential hypertension and history of hypercholesterolemia. She also has severe scoliosis. She has not had any symptoms to suggest ischemic heart disease or angina pectoris.  Since last visit she's had no new cardiac symptoms.  She is very careful with a low cholesterol diet.  Her weight is down 6 pounds intentionally. Current Outpatient Prescriptions  Medication Sig Dispense Refill  . aspirin 81 MG tablet Take 81 mg by mouth daily.        . metoprolol (LOPRESSOR) 50 MG tablet TAKE 1/2 A TABLET BY MOUTH ONCE DAILY  90 tablet  PRN  . Thiamine HCl (VITAMIN B-1) 50 MG tablet Take 50 mg by mouth daily.         No current facility-administered medications for this visit.    Allergies  Allergen Reactions  . Codeine   . Dipth, Acell Pertus, [Tetanus-Diphth-Acell Pertussis]     Allergic to diptheria  . Lipitor [Atorvastatin Calcium]     Fatigue  . Procaine Hcl     Patient Active Problem List   Diagnosis Date Noted  . Hypercholesterolemia 02/06/2011  . Palpitations 02/06/2011  . Benign hypertensive heart disease without heart failure 02/06/2011  . Scoliosis 02/06/2011    History  Smoking status  . Never Smoker   Smokeless tobacco  . Not on file    History  Alcohol Use No    Family History  Problem Relation Age of Onset  . Heart disease Mother   . Coronary artery disease Mother   . Parkinsonism Father     Review of Systems: The patient denies any heat or cold intolerance.  No weight gain or weight loss.  The patient denies headaches or blurry vision.  There is no cough or sputum production.  The patient denies dizziness.  There is no hematuria or hematochezia.  The patient denies any muscle aches or arthritis.  The patient  denies any rash.  The patient denies frequent falling or instability.  There is no history of depression or anxiety.  All other systems were reviewed and are negative.   Physical Exam: Filed Vitals:   05/17/13 1032  BP: 156/70  Pulse:    the general appearance reveals a well-developed well-nourished woman in no distress.  She has marked scoliosis.The head and neck exam reveals pupils equal and reactive.  Extraocular movements are full.  There is no scleral icterus.  The mouth and pharynx are normal.  The neck is supple.  The carotids reveal no bruits.  The jugular venous pressure is normal.  The  thyroid is not enlarged.  There is no lymphadenopathy.  The chest is clear to percussion and auscultation.  There are no rales or rhonchi.  Expansion of the chest is symmetrical.  The precordium is quiet.  The first heart sound is normal.  The second heart sound is physiologically split.  There is no murmur gallop rub or click.  There is no abnormal lift or heave.  The abdomen is soft and nontender.  The bowel sounds are normal.  The liver and spleen are not enlarged.  There are no abdominal masses.  There are no abdominal bruits.  Extremities reveal good pedal pulses.  There is no phlebitis or edema.  There is no cyanosis  or clubbing.  Strength is normal and symmetrical in all extremities.  There is no lateralizing weakness.  There are no sensory deficits.  The skin is warm and dry.  There is no rash.  EKG today shows normal sinus rhythm and since 03/25/12 there is been no significant change   Assessment / Plan: Continue same medication.  Recheck in 6 months for followup office visit lipid panel hepatic function panel and basal metabolic panel

## 2013-05-17 NOTE — Assessment & Plan Note (Signed)
She exercises at home using a treadmill and an exercise bike.  She avoids much meat.  She does eat some chicken and fish.  We reviewed her lab work which is satisfactory and shows improvement in her lipid profile.  She does not need to lose any more weight.

## 2013-11-09 ENCOUNTER — Other Ambulatory Visit (INDEPENDENT_AMBULATORY_CARE_PROVIDER_SITE_OTHER): Payer: Medicare Other

## 2013-11-09 DIAGNOSIS — E7800 Pure hypercholesterolemia, unspecified: Secondary | ICD-10-CM

## 2013-11-09 LAB — HEPATIC FUNCTION PANEL
ALK PHOS: 61 U/L (ref 39–117)
ALT: 22 U/L (ref 0–35)
AST: 36 U/L (ref 0–37)
Albumin: 4 g/dL (ref 3.5–5.2)
BILIRUBIN DIRECT: 0.1 mg/dL (ref 0.0–0.3)
TOTAL PROTEIN: 6.8 g/dL (ref 6.0–8.3)
Total Bilirubin: 0.7 mg/dL (ref 0.2–1.2)

## 2013-11-09 LAB — BASIC METABOLIC PANEL
BUN: 30 mg/dL — AB (ref 6–23)
CHLORIDE: 107 meq/L (ref 96–112)
CO2: 29 meq/L (ref 19–32)
CREATININE: 0.9 mg/dL (ref 0.4–1.2)
Calcium: 9.6 mg/dL (ref 8.4–10.5)
GFR: 63.44 mL/min (ref >60.00)
Glucose, Bld: 84 mg/dL (ref 70–99)
Potassium: 4.3 mEq/L (ref 3.5–5.1)
Sodium: 141 mEq/L (ref 135–145)

## 2013-11-09 LAB — LIPID PANEL
CHOL/HDL RATIO: 2
CHOLESTEROL: 193 mg/dL (ref 0–200)
HDL: 87.1 mg/dL (ref >39.00)
LDL Cholesterol: 97 mg/dL (ref 0–99)
TRIGLYCERIDES: 44 mg/dL (ref 0.0–149.0)
VLDL: 8.8 mg/dL (ref 0.0–40.0)

## 2013-11-09 NOTE — Progress Notes (Signed)
Quick Note:  Please make copy of labs for patient visit. ______ 

## 2013-11-16 ENCOUNTER — Ambulatory Visit (INDEPENDENT_AMBULATORY_CARE_PROVIDER_SITE_OTHER): Payer: Medicare Other | Admitting: Cardiology

## 2013-11-16 ENCOUNTER — Encounter: Payer: Self-pay | Admitting: Cardiology

## 2013-11-16 VITALS — BP 124/82 | HR 76 | Ht 60.0 in | Wt 118.0 lb

## 2013-11-16 DIAGNOSIS — I119 Hypertensive heart disease without heart failure: Secondary | ICD-10-CM | POA: Diagnosis not present

## 2013-11-16 DIAGNOSIS — R002 Palpitations: Secondary | ICD-10-CM | POA: Diagnosis not present

## 2013-11-16 DIAGNOSIS — E7800 Pure hypercholesterolemia, unspecified: Secondary | ICD-10-CM

## 2013-11-16 NOTE — Progress Notes (Signed)
Catherine Frost Date of Birth:  [birth year] [facility] [address] [place], [place]  [address] [phone]  Fax   [phone]  HPI: This pleasant 80 year old woman is seen for a scheduled 6 month followup office visit. She has a history of essential hypertension and history of hypercholesterolemia. She also has severe scoliosis. She has not had any symptoms to suggest ischemic heart disease or angina pectoris. Since last visit she's had no new cardiac symptoms. She is very careful with a low cholesterol diet.  She is a widow.  Her husband died in Nov 04, 2011.  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   Current Outpatient Prescriptions  Medication Sig Dispense Refill  . aspirin 81 MG tablet Take 81 mg by mouth daily.        . metoprolol (LOPRESSOR) 50 MG tablet TAKE 1/2 A TABLET BY MOUTH ONCE DAILY  90 tablet  PRN  . Thiamine HCl (VITAMIN B-1) 50 MG tablet Take 50 mg by mouth daily.         No current facility-administered medications for this visit.    Allergies  Allergen Reactions  . Codeine   . Dipth, Acell Pertus, [Tetanus-Diphth-Acell Pertussis]     Allergic to diptheria  . Lipitor [Atorvastatin Calcium]     Fatigue  . Procaine Hcl     Patient Active Problem List   Diagnosis Date Noted  . Hypercholesterolemia 02/06/2011  . Palpitations 02/06/2011  . Benign hypertensive heart disease without heart failure 02/06/2011  . Scoliosis 02/06/2011    History  Smoking status  . Never Smoker   Smokeless tobacco  . Not on file    History  Alcohol Use No    Family History  Problem Relation Age of Onset  . Heart disease Mother   . Coronary artery disease Mother   . Parkinsonism Father     Review of Systems: The patient denies any heat or cold intolerance.  No weight gain or weight loss.  The patient denies headaches or blurry vision.  There is no cough or sputum production.   The patient denies dizziness.  There is no hematuria or hematochezia.  The patient denies any muscle aches or arthritis.  The patient denies any rash.  The patient denies frequent falling or instability.  There is no history of depression or anxiety.  All other systems were reviewed and are negative.   Physical Exam: Filed Vitals:   11/16/13 1711  BP: 124/82  Pulse:    General appearance reveals a well-developed well-nourished elderly woman in no distress.  She has significant scoliosis.The head and neck exam reveals pupils equal and reactive.  Extraocular movements are full.  There is no scleral icterus.  The mouth and pharynx are normal.  The neck is supple.  The carotids reveal no bruits.  The jugular venous pressure is normal.  The  thyroid is not enlarged.  There is no lymphadenopathy.  The chest is clear to percussion and auscultation.  There are no rales or rhonchi.  Expansion of the chest is symmetrical.  The precordium is quiet.  The first heart sound is normal.  The second heart sound is physiologically split.  There is no murmur gallop rub or click.  There is no abnormal lift or heave.  The abdomen is soft and nontender.  The bowel sounds are normal.  The liver and spleen are not enlarged.  There are no  abdominal masses.  There are no abdominal bruits.  Extremities reveal good pedal pulses.  There is no phlebitis or edema.  There is no cyanosis or clubbing.  Strength is normal and symmetrical in all extremities.  There is no lateralizing weakness.  There are no sensory deficits.  The skin is warm and dry.  There is no rash.     Assessment / Plan: 1. benign hypertensive heart disease without heart failure. 2. hypercholesterolemia controlled with prudent diet 3. Palpitations 4. scoliosis  Continue same medication.  Recheck in 6 months for office visit lipid panel hepatic function panel and basal metabolic panel

## 2013-11-16 NOTE — Assessment & Plan Note (Signed)
The patient has not been having any chest pain dizziness or syncope.  He notes occasional isolated palpitations.  She has not had to take any extra metoprolol for these however.

## 2013-11-16 NOTE — Assessment & Plan Note (Signed)
Her lipids are satisfactory on careful diet.  Her HDL level is very favorable.  She is not on any statin therapy.  Continue current diet.

## 2013-11-16 NOTE — Patient Instructions (Signed)
Your physician recommends that you continue on your current medications as directed. Please refer to the Current Medication list given to you today.  Your physician wants you to follow-up in: 6 months with fasting labs (lp/bmet/hfp)  You will receive a reminder letter in the mail two months in advance. If you don't receive a letter, please call our office to schedule the follow-up appointment.  

## 2013-11-16 NOTE — Assessment & Plan Note (Signed)
She has very infrequent palpitations which are brief.  If they become more frequent, she can always take an extra half metoprolol as needed

## 2013-12-07 ENCOUNTER — Other Ambulatory Visit: Payer: Self-pay | Admitting: Cardiology

## 2014-01-03 DIAGNOSIS — Z01419 Encounter for gynecological examination (general) (routine) without abnormal findings: Secondary | ICD-10-CM | POA: Diagnosis not present

## 2014-01-03 DIAGNOSIS — Z124 Encounter for screening for malignant neoplasm of cervix: Secondary | ICD-10-CM | POA: Diagnosis not present

## 2014-01-03 DIAGNOSIS — Z0000 Encounter for general adult medical examination without abnormal findings: Secondary | ICD-10-CM | POA: Diagnosis not present

## 2014-01-03 DIAGNOSIS — Z87410 Personal history of cervical dysplasia: Secondary | ICD-10-CM | POA: Diagnosis not present

## 2014-01-16 DIAGNOSIS — H2510 Age-related nuclear cataract, unspecified eye: Secondary | ICD-10-CM | POA: Diagnosis not present

## 2014-01-16 DIAGNOSIS — H34239 Retinal artery branch occlusion, unspecified eye: Secondary | ICD-10-CM | POA: Diagnosis not present

## 2014-01-16 DIAGNOSIS — Z961 Presence of intraocular lens: Secondary | ICD-10-CM | POA: Diagnosis not present

## 2014-01-16 DIAGNOSIS — H25019 Cortical age-related cataract, unspecified eye: Secondary | ICD-10-CM | POA: Diagnosis not present

## 2014-01-17 DIAGNOSIS — Z9889 Other specified postprocedural states: Secondary | ICD-10-CM | POA: Diagnosis not present

## 2014-01-17 DIAGNOSIS — H2510 Age-related nuclear cataract, unspecified eye: Secondary | ICD-10-CM | POA: Diagnosis not present

## 2014-01-17 DIAGNOSIS — H04129 Dry eye syndrome of unspecified lacrimal gland: Secondary | ICD-10-CM | POA: Diagnosis not present

## 2014-01-17 DIAGNOSIS — H3509 Other intraretinal microvascular abnormalities: Secondary | ICD-10-CM | POA: Diagnosis not present

## 2014-01-17 DIAGNOSIS — H40019 Open angle with borderline findings, low risk, unspecified eye: Secondary | ICD-10-CM | POA: Diagnosis not present

## 2014-01-19 ENCOUNTER — Telehealth: Payer: Self-pay | Admitting: Cardiology

## 2014-01-19 NOTE — Telephone Encounter (Signed)
Notified Catherine Frost that Dr. Patty SermonsBrackbill agreed with increasing Metoprolol 25 mg BID.  She just got a refill so will call when she needs new Rx with the new directions.

## 2014-01-19 NOTE — Telephone Encounter (Signed)
Patient has questions about medication, please call and advise.  °

## 2014-01-19 NOTE — Telephone Encounter (Signed)
I agree with increasing her metoprolol tartrate to 25 mg BID

## 2014-01-19 NOTE — Telephone Encounter (Signed)
Called stating she saw Dr. Burgess Estelleanner on Monday for routine eye exam.  He told her she had a "bleed" in her (L) eye and wanted her to see a specialist.  She was a former pt of Dr. Cecilie [facility]icksJacklin but [facility].  She was able to get in to see Dr. 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 There is no treatment and was told would not effect her vision. He did recommend that she restart Occuvit and also to increase her Metoprolol to 25 mg BID.  She states her BP varies.  Today was 147/72 and 139/60 with HR 78-80. Advised would forward to Dr. Patty SermonsBrackbill for advice regarding increasing her Metoprolol.  She states she will be out of town Fri-Sun but will be back on Mon. Advised somone would call her with his recommendations.

## 2014-01-23 NOTE — Addendum Note (Signed)
Addended by: Regis BillPRATT, Bond Grieshop B on: 01/23/2014 09:27 AM   Modules accepted: Orders

## 2014-01-31 DIAGNOSIS — H40019 Open angle with borderline findings, low risk, unspecified eye: Secondary | ICD-10-CM | POA: Diagnosis not present

## 2014-01-31 DIAGNOSIS — H04129 Dry eye syndrome of unspecified lacrimal gland: Secondary | ICD-10-CM | POA: Diagnosis not present

## 2014-01-31 DIAGNOSIS — Z9889 Other specified postprocedural states: Secondary | ICD-10-CM | POA: Diagnosis not present

## 2014-01-31 DIAGNOSIS — H3509 Other intraretinal microvascular abnormalities: Secondary | ICD-10-CM | POA: Diagnosis not present

## 2014-01-31 DIAGNOSIS — H2510 Age-related nuclear cataract, unspecified eye: Secondary | ICD-10-CM | POA: Diagnosis not present

## 2014-02-13 ENCOUNTER — Other Ambulatory Visit: Payer: Self-pay

## 2014-02-13 MED ORDER — METOPROLOL TARTRATE 50 MG PO TABS: ORAL_TABLET | ORAL | Status: DC

## 2014-03-27 DIAGNOSIS — Z23 Encounter for immunization: Secondary | ICD-10-CM | POA: Diagnosis not present

## 2014-04-04 ENCOUNTER — Other Ambulatory Visit: Payer: Self-pay | Admitting: Cardiology

## 2014-04-04 DIAGNOSIS — Z1231 Encounter for screening mammogram for malignant neoplasm of breast: Secondary | ICD-10-CM

## 2014-05-08 ENCOUNTER — Other Ambulatory Visit (INDEPENDENT_AMBULATORY_CARE_PROVIDER_SITE_OTHER): Payer: Medicare Other | Admitting: *Deleted

## 2014-05-08 DIAGNOSIS — E7800 Pure hypercholesterolemia, unspecified: Secondary | ICD-10-CM

## 2014-05-08 DIAGNOSIS — I119 Hypertensive heart disease without heart failure: Secondary | ICD-10-CM

## 2014-05-08 LAB — LIPID PANEL
Cholesterol: 194 mg/dL (ref 0–200)
HDL: 69.3 mg/dL (ref >39.00)
LDL Cholesterol: 114 mg/dL — ABNORMAL HIGH (ref 0–99)
NonHDL: 124.7
Total CHOL/HDL Ratio: 3
Triglycerides: 55 mg/dL (ref 0.0–149.0)
VLDL: 11 mg/dL (ref 0.0–40.0)

## 2014-05-08 LAB — HEPATIC FUNCTION PANEL
ALT: 21 U/L (ref 0–35)
AST: 34 U/L (ref 0–37)
Albumin: 4 g/dL (ref 3.5–5.2)
Alkaline Phosphatase: 62 U/L (ref 39–117)
Bilirubin, Direct: 0.1 mg/dL (ref 0.0–0.3)
Total Bilirubin: 0.9 mg/dL (ref 0.2–1.2)
Total Protein: 6.7 g/dL (ref 6.0–8.3)

## 2014-05-08 LAB — BASIC METABOLIC PANEL
BUN: 21 mg/dL (ref 6–23)
CHLORIDE: 109 meq/L (ref 96–112)
CO2: 24 meq/L (ref 19–32)
Calcium: 9.5 mg/dL (ref 8.4–10.5)
Creatinine, Ser: 0.8 mg/dL (ref 0.4–1.2)
GFR: 72.59 mL/min (ref >60.00)
Glucose, Bld: 90 mg/dL (ref 70–99)
Potassium: 4.9 mEq/L (ref 3.5–5.1)
Sodium: 144 mEq/L (ref 135–145)

## 2014-05-08 NOTE — Progress Notes (Signed)
Quick Note:  Please make copy of labs for patient visit. ______ 

## 2014-05-09 ENCOUNTER — Ambulatory Visit (HOSPITAL_COMMUNITY)
Admission: RE | Admit: 2014-05-09 | Discharge: 2014-05-09 | Disposition: A | Discharge status: Home / Self Care | Payer: Medicare Other | Source: Ambulatory Visit | Attending: Cardiology | Admitting: Cardiology

## 2014-05-09 DIAGNOSIS — Z1231 Encounter for screening mammogram for malignant neoplasm of breast: Secondary | ICD-10-CM | POA: Diagnosis not present

## 2014-05-09 DIAGNOSIS — R928 Other abnormal and inconclusive findings on diagnostic imaging of breast: Secondary | ICD-10-CM | POA: Diagnosis not present

## 2014-05-11 ENCOUNTER — Other Ambulatory Visit: Payer: Self-pay | Admitting: Cardiology

## 2014-05-11 DIAGNOSIS — R928 Other abnormal and inconclusive findings on diagnostic imaging of breast: Secondary | ICD-10-CM

## 2014-05-15 ENCOUNTER — Encounter: Payer: Self-pay | Admitting: Cardiology

## 2014-05-15 ENCOUNTER — Ambulatory Visit (INDEPENDENT_AMBULATORY_CARE_PROVIDER_SITE_OTHER): Payer: Medicare Other | Admitting: Cardiology

## 2014-05-15 VITALS — BP 118/80 | HR 62 | Ht 60.0 in | Wt 114.0 lb

## 2014-05-15 DIAGNOSIS — I119 Hypertensive heart disease without heart failure: Secondary | ICD-10-CM

## 2014-05-15 DIAGNOSIS — E7800 Pure hypercholesterolemia, unspecified: Secondary | ICD-10-CM

## 2014-05-15 DIAGNOSIS — R002 Palpitations: Secondary | ICD-10-CM

## 2014-05-15 DIAGNOSIS — M419 Scoliosis, unspecified: Secondary | ICD-10-CM

## 2014-05-15 MED ORDER — METOPROLOL TARTRATE 50 MG PO TABS: ORAL_TABLET | ORAL | Status: DC

## 2014-05-15 NOTE — Assessment & Plan Note (Signed)
The patient is noticing mild fatigue from her metoprolol.  However overall she feels well and does not want to change her medicines at this point.  Her blood pressure has been doing well.  She exercises for a total of 3 hours a week aerobically.  She is not having a chest pain or shortness of breath.

## 2014-05-15 NOTE — Progress Notes (Signed)
Catherine OleaElaine L Frost Date of Birth:  1928-06-03 [facility] [address] [place], [place]  [address] [phone]  Fax   [phone]  HPI: This pleasant 80 year old woman is seen for a scheduled 6 month followup office visit. She has a history of essential hypertension and history of hypercholesterolemia. She also has severe scoliosis. She has not had any symptoms to suggest ischemic heart disease or angina pectoris. Since last visit she's had no new cardiac symptoms. She is very careful with a low cholesterol diet.  She is a widow.  Her husband died in 2013.  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   Current Outpatient Prescriptions  Medication Sig Dispense Refill  . aspirin 81 MG tablet Take 81 mg by mouth daily.      Marland Kitchen. FLUZONE HIGH-DOSE 0.5 ML SUSY   0  . metoprolol (LOPRESSOR) 50 MG tablet take 1/2 tablet by mouth in am and in pm 90 tablet 3  . Thiamine HCl (VITAMIN B-1) 50 MG tablet Take 50 mg by mouth daily.       No current facility-administered medications for this visit.    Allergies  Allergen Reactions  . Codeine   . Dipth, Acell Pertus, [Tetanus-Diphth-Acell Pertussis]     Allergic to diptheria  . Lipitor [Atorvastatin Calcium]     Fatigue  . Procaine Hcl     Patient Active Problem List   Diagnosis Date Noted  . Hypercholesterolemia 02/06/2011  . Palpitations 02/06/2011  . Benign hypertensive heart disease without heart failure 02/06/2011  . Scoliosis 02/06/2011    History  Smoking status  . Never Smoker   Smokeless tobacco  . Not on file    History  Alcohol Use No    Family History  Problem Relation Age of Onset  . Heart disease Mother   . Coronary artery disease Mother   . Parkinsonism Father     Review of Systems: The patient denies any heat or cold intolerance.  No weight gain or weight loss.  The patient denies headaches or blurry vision.  There is  no cough or sputum production.  The patient denies dizziness.  There is no hematuria or hematochezia.  The patient denies any muscle aches or arthritis.  The patient denies any rash.  The patient denies frequent falling or instability.  There is no history of depression or anxiety.  All other systems were reviewed and are negative.   Physical Exam: Filed Vitals:   05/15/14 0811  BP: 118/80  Pulse: 62   General appearance reveals a well-developed well-nourished elderly woman in no distress.  She has significant scoliosis.The head and neck exam reveals pupils equal and reactive.  Extraocular movements are full.  There is no scleral icterus.  The mouth and pharynx are normal.  The neck is supple.  The carotids reveal no bruits.  The jugular venous pressure is normal.  The  thyroid is not enlarged.  There is no lymphadenopathy.  The chest is clear to percussion and auscultation.  There are no rales or rhonchi.  Expansion of the chest is symmetrical.  The precordium is quiet.  The first heart sound is normal.  The second heart sound is physiologically split.  There is no murmur gallop rub or click.  There is no abnormal lift or heave.  The abdomen is soft and nontender.  The bowel sounds are normal.  The liver and spleen are  not enlarged.  There are no abdominal masses.  There are no abdominal bruits.  Extremities reveal good pedal pulses.  There is no phlebitis or edema.  There is no cyanosis or clubbing.  Strength is normal and symmetrical in all extremities.  There is no lateralizing weakness.  There are no sensory deficits.  The skin is warm and dry.  There is no rash.     Assessment / Plan: 1. benign hypertensive heart disease without heart failure. 2. hypercholesterolemia controlled with prudent diet 3. Palpitations 4. scoliosis  Continue same medication.  Recheck in 6 months for office visit lipid panel hepatic function panel and basal metabolic panel

## 2014-05-15 NOTE — Assessment & Plan Note (Signed)
The patient is watching her diet carefully.  Her lipids are satisfactory.  LDL is slightly higher this time.  She is not on statin therapy.  She is allergic to Lipitor.

## 2014-05-15 NOTE — Patient Instructions (Signed)
Your physician recommends that you continue on your current medications as directed. Please refer to the Current Medication list given to you today.  Your physician wants you to follow-up in: 6 months with fasting labs (lp/bmet/hfp)  You will receive a reminder letter in the mail two months in advance. If you don't receive a letter, please call our office to schedule the follow-up appointment.  

## 2014-05-31 ENCOUNTER — Ambulatory Visit
Admission: RE | Admit: 2014-05-31 | Discharge: 2014-05-31 | Disposition: A | Discharge status: Home / Self Care | Payer: Medicare Other | Source: Ambulatory Visit | Attending: Cardiology | Admitting: Cardiology

## 2014-05-31 DIAGNOSIS — R928 Other abnormal and inconclusive findings on diagnostic imaging of breast: Secondary | ICD-10-CM

## 2014-05-31 DIAGNOSIS — N63 Unspecified lump in breast: Secondary | ICD-10-CM | POA: Diagnosis not present

## 2014-11-06 ENCOUNTER — Other Ambulatory Visit (INDEPENDENT_AMBULATORY_CARE_PROVIDER_SITE_OTHER): Payer: Medicare Other | Admitting: *Deleted

## 2014-11-06 DIAGNOSIS — E7800 Pure hypercholesterolemia, unspecified: Secondary | ICD-10-CM

## 2014-11-06 DIAGNOSIS — I119 Hypertensive heart disease without heart failure: Secondary | ICD-10-CM | POA: Diagnosis not present

## 2014-11-06 LAB — BASIC METABOLIC PANEL
BUN: 22 mg/dL (ref 6–23)
CO2: 31 mEq/L (ref 19–32)
Calcium: 9.8 mg/dL (ref 8.4–10.5)
Chloride: 104 mEq/L (ref 96–112)
Creatinine, Ser: 0.93 mg/dL (ref 0.40–1.20)
GFR: 60.94 mL/min (ref >60.00)
Glucose, Bld: 91 mg/dL (ref 70–99)
Potassium: 4.4 mEq/L (ref 3.5–5.1)
Sodium: 138 mEq/L (ref 135–145)

## 2014-11-06 LAB — HEPATIC FUNCTION PANEL
ALT: 20 U/L (ref 0–35)
AST: 32 U/L (ref 0–37)
Albumin: 3.8 g/dL (ref 3.5–5.2)
Alkaline Phosphatase: 66 U/L (ref 39–117)
BILIRUBIN TOTAL: 0.7 mg/dL (ref 0.2–1.2)
Bilirubin, Direct: 0.1 mg/dL (ref 0.0–0.3)
Total Protein: 6.8 g/dL (ref 6.0–8.3)

## 2014-11-06 LAB — LIPID PANEL
CHOLESTEROL: 194 mg/dL (ref 0–200)
HDL: 87.5 mg/dL (ref >39.00)
LDL Cholesterol: 95 mg/dL (ref 0–99)
NonHDL: 106.5
TRIGLYCERIDES: 56 mg/dL (ref 0.0–149.0)
Total CHOL/HDL Ratio: 2
VLDL: 11.2 mg/dL (ref 0.0–40.0)

## 2014-11-06 NOTE — Progress Notes (Signed)
Quick Note:  Please make copy of labs for patient visit. ______ 

## 2014-11-07 DIAGNOSIS — H35012 Changes in retinal vascular appearance, left eye: Secondary | ICD-10-CM | POA: Diagnosis not present

## 2014-11-07 DIAGNOSIS — H40013 Open angle with borderline findings, low risk, bilateral: Secondary | ICD-10-CM | POA: Diagnosis not present

## 2014-11-07 DIAGNOSIS — H2513 Age-related nuclear cataract, bilateral: Secondary | ICD-10-CM | POA: Diagnosis not present

## 2014-11-07 DIAGNOSIS — Z9889 Other specified postprocedural states: Secondary | ICD-10-CM | POA: Diagnosis not present

## 2014-11-13 ENCOUNTER — Ambulatory Visit (INDEPENDENT_AMBULATORY_CARE_PROVIDER_SITE_OTHER): Payer: Medicare Other | Admitting: Cardiology

## 2014-11-13 ENCOUNTER — Encounter: Payer: Self-pay | Admitting: Cardiology

## 2014-11-13 VITALS — BP 130/80 | HR 65 | Ht 60.0 in | Wt 118.4 lb

## 2014-11-13 DIAGNOSIS — I119 Hypertensive heart disease without heart failure: Secondary | ICD-10-CM | POA: Diagnosis not present

## 2014-11-13 DIAGNOSIS — R002 Palpitations: Secondary | ICD-10-CM

## 2014-11-13 DIAGNOSIS — M419 Scoliosis, unspecified: Secondary | ICD-10-CM

## 2014-11-13 DIAGNOSIS — E7800 Pure hypercholesterolemia, unspecified: Secondary | ICD-10-CM

## 2014-11-13 MED ORDER — METOPROLOL TARTRATE 25 MG PO TABS: 25.0000 mg | ORAL_TABLET | Freq: Two times a day (BID) | ORAL | Status: DC

## 2014-11-13 NOTE — Progress Notes (Signed)
Cardiology Office Note   Date:  11/13/2014   ID:  Catherine Frost, Catherine Frost 12-Feb-1928, MRN [id number]  PCP:  No PCP Per Patient  Cardiologist: Cassell Clement MD  No chief complaint on file.     History of Present Illness: Catherine Frost is a 81 y.o. female who presents for a six-month follow-up visit.  This pleasant 81 year old woman is seen for a scheduled 6 month followup office visit. She has a history of essential hypertension and history of hypercholesterolemia. She also has severe scoliosis. She has not had any symptoms to suggest ischemic heart disease or angina pectoris. Since last visit she's had no new cardiac symptoms. She is very careful with a low cholesterol diet. She is a widow. Her husband died in Oct 27, 2011. 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 Her weight is up 4 pounds since last visit.  She has been going to a lot of graduation parties and her diet has not been as careful.  Fortunately, her lipid studies remain excellent.  Past Medical History  Diagnosis Date  . Hypercholesterolemia     Controlled by diet  . Hypertension     Controlled by medications  . Scoliosis     Hx of  . Palpitations     Hx of    Past Surgical History  Procedure Laterality Date  . US echocardiography  11-10-2005    EF 55-60%     Current Outpatient Prescriptions  Medication Sig Dispense Refill  . aspirin 81 MG tablet Take 81 mg by mouth daily.      . metoprolol (LOPRESSOR) 25 MG tablet Take 1 tablet (25 mg total) by mouth 2 (two) times daily. 180 tablet 3  . Thiamine HCl (VITAMIN B-1) 50 MG tablet Take 50 mg by mouth daily.       No current facility-administered medications for this visit.    Allergies:   Codeine; Dipth, acell pertus,; Lipitor; and Procaine hcl    Social History:  The patient  reports that she has never smoked. She does not have any smokeless tobacco history on file. She reports that  she does not drink alcohol.   Family History:  The patient's family history includes Coronary artery disease in her mother; Heart disease in her mother; Parkinsonism in her father.    ROS:  Please see the history of present illness.   Otherwise, review of systems are positive for none.   All other systems are reviewed and negative.    PHYSICAL EXAM: VS:  BP 130/80 mmHg  Pulse 65  Ht 5' (1.524 m)  Wt 118 lb 6.4 oz (53.706 kg)  BMI 23.12 kg/m2 , BMI Body mass index is 23.12 kg/(m^2). GEN: Well nourished, well developed, in no acute distress HEENT: normal Neck: no JVD, carotid bruits, or masses Cardiac: RRR; no murmurs, rubs, or gallops,no edema  Respiratory:  clear to auscultation bilaterally, normal work of breathing GI: soft, nontender, nondistended, + BS MS: no deformity or atrophy.  The patient has marked scoliosis of her thoracic spine. Skin: warm and dry, no rash Neuro:  Strength and sensation are intact Psych: euthymic mood, full affect   EKG:  EKG is not ordered today.    Recent Labs: 11/06/2014: ALT 20; BUN 22; Creatinine 0.93; Potassium 4.4; Sodium 138    Lipid Panel    Component Value Date/Time   CHOL 194 11/06/2014 0802   TRIG 56.0 11/06/2014 0802  HDL 87.50 11/06/2014 0802   CHOLHDL 2 11/06/2014 0802   VLDL 11.2 11/06/2014 0802   LDLCALC 95 11/06/2014 0802   LDLDIRECT 110.9 03/24/2012 0940      Wt Readings from Last 3 Encounters:  11/13/14 118 lb 6.4 oz (53.706 kg)  05/15/14 114 lb (51.71 kg)  11/16/13 118 lb (53.524 kg)      **   ASSESSMENT AND PLAN: 1. benign hypertensive heart disease without heart failure. 2. hypercholesterolemia controlled with prudent diet 3. Palpitations 4. scoliosis  Continue same medication. Recheck in 6 months for office visit, EKG, lipid panel hepatic function panel and basal metabolic panel    Current medicines are reviewed at length with the patient today.  The patient does not have concerns regarding  medicines.  The following changes have been made:  no change  Labs/ tests ordered today include:   Orders Placed This Encounter  Procedures  . Lipid panel  . Hepatic function panel  . Basic metabolic panel      Signed, Cassell Clementhomas Thamara Leger MD 11/13/2014 8:24 AM    [facility] [address], [place], [place]  [address] Phone: [phone]; Fax: [phone]

## 2014-11-13 NOTE — Patient Instructions (Signed)
Medication Instructions:  Rx for Metoprolol 25 mg twice a day sent to pharmacy  Labwork: none  Testing/Procedures: none  Follow-Up: Your physician wants you to follow-up in: 6 months with fasting labs (lp/bmet/hfp) and ekg  You will receive a reminder letter in the mail two months in advance. If you don't receive a letter, please call our office to schedule the follow-up appointment.   Any Other Special Instructions Will Be Listed Below (If Applicable).

## 2015-03-29 DIAGNOSIS — Z23 Encounter for immunization: Secondary | ICD-10-CM | POA: Diagnosis not present

## 2015-05-09 ENCOUNTER — Other Ambulatory Visit (INDEPENDENT_AMBULATORY_CARE_PROVIDER_SITE_OTHER): Payer: Medicare Other | Admitting: *Deleted

## 2015-05-09 DIAGNOSIS — I119 Hypertensive heart disease without heart failure: Secondary | ICD-10-CM

## 2015-05-09 DIAGNOSIS — E7800 Pure hypercholesterolemia, unspecified: Secondary | ICD-10-CM

## 2015-05-09 LAB — BASIC METABOLIC PANEL
BUN: 18 mg/dL (ref 7–25)
CHLORIDE: 105 mmol/L (ref 98–110)
CO2: 28 mmol/L (ref 20–31)
Calcium: 9.5 mg/dL (ref 8.6–10.4)
Creat: 0.9 mg/dL — ABNORMAL HIGH (ref 0.60–0.88)
Glucose, Bld: 82 mg/dL (ref 65–99)
POTASSIUM: 4.4 mmol/L (ref 3.5–5.3)
Sodium: 139 mmol/L (ref 135–146)

## 2015-05-09 LAB — HEPATIC FUNCTION PANEL
ALT: 17 U/L (ref 6–29)
AST: 30 U/L (ref 10–35)
Albumin: 4 g/dL (ref 3.6–5.1)
Alkaline Phosphatase: 64 U/L (ref 33–130)
BILIRUBIN DIRECT: 0.1 mg/dL (ref <=0.2)
BILIRUBIN INDIRECT: 0.4 mg/dL (ref 0.2–1.2)
BILIRUBIN TOTAL: 0.5 mg/dL (ref 0.2–1.2)
Total Protein: 6.9 g/dL (ref 6.1–8.1)

## 2015-05-09 LAB — LIPID PANEL
Cholesterol: 174 mg/dL (ref 125–200)
HDL: 86 mg/dL (ref >=46)
LDL Cholesterol: 78 mg/dL (ref <130)
Total CHOL/HDL Ratio: 2 Ratio (ref <=5.0)
Triglycerides: 52 mg/dL (ref <150)
VLDL: 10 mg/dL (ref <30)

## 2015-05-09 NOTE — Addendum Note (Signed)
Addended by: Tonita PhoenixBOWDEN, Rohail Klees K on: 05/09/2015 07:56 AM   Modules accepted: Orders

## 2015-05-09 NOTE — Progress Notes (Signed)
Quick Note:  Please make copy of labs for patient visit. ______ 

## 2015-05-15 DIAGNOSIS — H35371 Puckering of macula, right eye: Secondary | ICD-10-CM | POA: Diagnosis not present

## 2015-05-15 DIAGNOSIS — H43812 Vitreous degeneration, left eye: Secondary | ICD-10-CM | POA: Diagnosis not present

## 2015-05-15 DIAGNOSIS — H348322 Tributary (branch) retinal vein occlusion, left eye, stable: Secondary | ICD-10-CM | POA: Diagnosis not present

## 2015-05-16 ENCOUNTER — Ambulatory Visit (INDEPENDENT_AMBULATORY_CARE_PROVIDER_SITE_OTHER): Payer: Medicare Other | Admitting: Cardiology

## 2015-05-16 ENCOUNTER — Encounter: Payer: Self-pay | Admitting: Cardiology

## 2015-05-16 VITALS — BP 140/90 | HR 55 | Ht 59.0 in | Wt 112.0 lb

## 2015-05-16 DIAGNOSIS — I119 Hypertensive heart disease without heart failure: Secondary | ICD-10-CM | POA: Diagnosis not present

## 2015-05-16 DIAGNOSIS — R002 Palpitations: Secondary | ICD-10-CM

## 2015-05-16 DIAGNOSIS — M419 Scoliosis, unspecified: Secondary | ICD-10-CM

## 2015-05-16 DIAGNOSIS — E7800 Pure hypercholesterolemia, unspecified: Secondary | ICD-10-CM

## 2015-05-16 NOTE — Progress Notes (Signed)
Cardiology Office Note   Date:  05/16/2015   ID:  Catherine Frost, DOB [birth year], MRN [id number]  PCP:  No PCP Per Patient  Cardiologist: Cassell Clementhomas Tosca Pletz MD  Chief Complaint  Patient presents with  . Hypertension      History of Present Illness: Catherine Frost is a 81 y.o. female who presents for  Six-month follow-up visit.  This pleasant 81 year old woman is seen for a scheduled 6 month followup office visit. She has a history of essential hypertension and history of hypercholesterolemia. She also has severe scoliosis. She has not had any symptoms to suggest ischemic heart disease or angina pectoris. Since last visit she's had no new cardiac symptoms. She is very careful with a low cholesterol diet. She is a widow. Her husband died in 2013. 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 Her weight is  Down 6 pounds since last visit.   Past Medical History  Diagnosis Date  . Hypercholesterolemia     Controlled by diet  . Hypertension     Controlled by medications  . Scoliosis     Hx of  . Palpitations     Hx of    Past Surgical History  Procedure Laterality Date  . [place] echocardiography  11-10-2005    EF 55-60%     Current Outpatient Prescriptions  Medication Sig Dispense Refill  . aspirin 81 MG tablet Take 81 mg by mouth daily.      . metoprolol (LOPRESSOR) 25 MG tablet Take 1 tablet (25 mg total) by mouth 2 (two) times daily. 180 tablet 3  . Thiamine HCl (VITAMIN B-1) 50 MG tablet Take 50 mg by mouth daily.       No current facility-administered medications for this visit.    Allergies:   Codeine; Dipth, acell pertus,; Lipitor; and Procaine hcl    Social History:  The patient  reports that she has never smoked. She does not have any smokeless tobacco history on file. She reports that she does not drink alcohol.   Family History:  The patient's family history includes Coronary artery  disease in her mother; Heart disease in her mother; Parkinsonism in her father.    ROS:  Please see the history of present illness.   Otherwise, review of systems are positive for none.   All other systems are reviewed and negative.    PHYSICAL EXAM: VS:  BP 140/90 mmHg  Pulse 55  Ht 4\' 11"  (1.499 m)  Wt 112 lb (50.803 kg)  BMI 22.61 kg/m2  SpO2 95% , BMI Body mass index is 22.61 kg/(m^2). GEN: Well nourished, well developed, in no acute distress HEENT: normal Neck: no JVD, carotid bruits, or masses Cardiac: RRR; no murmurs, rubs, or gallops,no edema  Respiratory:  clear to auscultation bilaterally, normal work of breathing GI: soft, nontender, nondistended, + BS MS: no deformity or atrophy Skin: warm and dry, no rash Neuro:  Strength and sensation are intact Psych: euthymic mood, full affect   EKG:  EKG is ordered today. The ekg ordered today demonstrates  Sinus bradycardia.  Left axis deviation.  No ischemic changes.   Recent Labs: 05/09/2015: ALT 17; BUN 18; Creat 0.90*; Potassium 4.4; Sodium 139    Lipid Panel    Component Value Date/Time   CHOL 174 05/09/2015 0756   TRIG 52 05/09/2015 0756   HDL 86 05/09/2015 0756   CHOLHDL 2.0 05/09/2015 0756   VLDL  10 05/09/2015 0756   LDLCALC 78 05/09/2015 0756   LDLDIRECT 110.9 03/24/2012 0940      Wt Readings from Last 3 Encounters:  05/16/15 112 lb (50.803 kg)  11/13/14 118 lb 6.4 oz (53.706 kg)  05/15/14 114 lb (51.71 kg)         ASSESSMENT AND PLAN:  1. benign hypertensive heart disease without heart failure. 2. hypercholesterolemia controlled with prudent diet 3. Palpitations 4. scoliosis  Continue same medication. Recheck in 6 months for office visit, , lipid panel hepatic function panel and basal metabolic panel   Current medicines are reviewed at length with the patient today.  The patient does not have concerns regarding medicines.  The following changes have been made:  no change  Labs/ tests  ordered today include:   Orders Placed This Encounter  Procedures  . EKG 12-Lead     disposition: Continue current medication.  Recheck in 6 months for office visit lipid panel hepatic function panel and basal metabolic panel  Signed, Cassell Clement MD 05/16/2015 8:50 AM    [facility] [address] [place], [place], [place]  [address] Phone: [phone]; Fax: [phone]

## 2015-05-16 NOTE — Patient Instructions (Signed)
Medication Instructions:  Your physician recommends that you continue on your current medications as directed. Please refer to the Current Medication list given to you today.  Labwork: none  Testing/Procedures: none  Follow-Up: Your physician recommends that you schedule a follow-up appointment in: 6 months with fasting labs (lp/bmet/hfp) with Lori G NP or Scott W PA   If you need a refill on your cardiac medications before your next appointment, please call your pharmacy.  

## 2015-11-09 ENCOUNTER — Emergency Department (HOSPITAL_COMMUNITY)
Admission: EM | Admit: 2015-11-09 | Discharge: 2015-11-09 | Disposition: A | Discharge status: Home / Self Care | Payer: Medicare Other | Attending: Emergency Medicine | Admitting: Emergency Medicine

## 2015-11-09 ENCOUNTER — Emergency Department (HOSPITAL_COMMUNITY): Payer: Medicare Other

## 2015-11-09 ENCOUNTER — Telehealth: Payer: Self-pay | Admitting: Nurse Practitioner

## 2015-11-09 ENCOUNTER — Encounter (HOSPITAL_COMMUNITY): Payer: Self-pay

## 2015-11-09 DIAGNOSIS — Z8639 Personal history of other endocrine, nutritional and metabolic disease: Secondary | ICD-10-CM | POA: Diagnosis not present

## 2015-11-09 DIAGNOSIS — R0789 Other chest pain: Secondary | ICD-10-CM | POA: Diagnosis not present

## 2015-11-09 DIAGNOSIS — I10 Essential (primary) hypertension: Secondary | ICD-10-CM | POA: Diagnosis not present

## 2015-11-09 DIAGNOSIS — M419 Scoliosis, unspecified: Secondary | ICD-10-CM | POA: Insufficient documentation

## 2015-11-09 DIAGNOSIS — R1013 Epigastric pain: Secondary | ICD-10-CM | POA: Diagnosis not present

## 2015-11-09 DIAGNOSIS — R079 Chest pain, unspecified: Secondary | ICD-10-CM | POA: Diagnosis not present

## 2015-11-09 DIAGNOSIS — Z7982 Long term (current) use of aspirin: Secondary | ICD-10-CM | POA: Diagnosis not present

## 2015-11-09 DIAGNOSIS — Z79899 Other long term (current) drug therapy: Secondary | ICD-10-CM | POA: Diagnosis not present

## 2015-11-09 LAB — BASIC METABOLIC PANEL
Anion gap: 9 (ref 5–15)
BUN: 24 mg/dL — ABNORMAL HIGH (ref 6–20)
CHLORIDE: 106 mmol/L (ref 101–111)
CO2: 26 mmol/L (ref 22–32)
Calcium: 9.5 mg/dL (ref 8.9–10.3)
Creatinine, Ser: 1.16 mg/dL — ABNORMAL HIGH (ref 0.44–1.00)
GFR calc non Af Amer: 42 mL/min — ABNORMAL LOW (ref >60)
GFR, EST AFRICAN AMERICAN: 48 mL/min — AB (ref >60)
Glucose, Bld: 100 mg/dL — ABNORMAL HIGH (ref 65–99)
POTASSIUM: 4.3 mmol/L (ref 3.5–5.1)
SODIUM: 141 mmol/L (ref 135–145)

## 2015-11-09 LAB — CBC
HCT: 37.7 % (ref 36.0–46.0)
HEMOGLOBIN: 12.4 g/dL (ref 12.0–15.0)
MCH: 30.5 pg (ref 26.0–34.0)
MCHC: 32.9 g/dL (ref 30.0–36.0)
MCV: 92.9 fL (ref 78.0–100.0)
Platelets: 164 10*3/uL (ref 150–400)
RBC: 4.06 MIL/uL (ref 3.87–5.11)
RDW: 13 % (ref 11.5–15.5)
WBC: 5.6 10*3/uL (ref 4.0–10.5)

## 2015-11-09 LAB — I-STAT TROPONIN, ED
TROPONIN I, POC: 0.01 ng/mL (ref 0.00–0.08)
TROPONIN I, POC: 0.03 ng/mL (ref 0.00–0.08)

## 2015-11-09 NOTE — ED Notes (Signed)
Pt presents with onset of L sided chest pain that began this morning.  Pt reports her home alarm began to go off with onset of pain.  Pt denies any shortness of breath, nausea or diaphoresis.

## 2015-11-09 NOTE — ED Provider Notes (Signed)
CSN: [id number]     Arrival date & time 11/09/15  1023 History   First MD Initiated Contact with Patient 11/09/15 1040     Chief Complaint  Patient presents with  . Chest Pain     (Consider location/radiation/quality/duration/timing/severity/associated sxs/prior Treatment) Patient is a 82 y.o. female presenting with chest pain. The history is provided by the patient.  Chest Pain Pain location:  Epigastric and substernal area Pain quality: pressure and sharp   Pain radiates to:  Does not radiate Pain radiates to the back: no   Pain severity:  Moderate Onset quality:  Gradual Duration:  1 hour Timing:  Rare Progression:  Resolved Chronicity:  New Relieved by:  Nothing Exacerbated by: stress. Ineffective treatments:  None tried Associated symptoms: no dizziness, no fever, no headache, no nausea, no palpitations, no shortness of breath and not vomiting   Risk factors: hypertension   Risk factors: no coronary artery disease    82 yo F With a chief complaint of chest pain. Patient was At home and was having problems with her home alarm system. It was causing her quite some stress. She sat down after it was resolved and felt that she had pinpoint tenderness to the left side of her chest. This did not resolve over the course of about 10 minutes so she called 911. Symptoms had resolved by the time she arrived here she estimates was about 45 minutes to an hour. Denies any shortness of breath or diaphoresis with this. Has a history of hypertension hyperlipidemia. Her mom had an MI but in her 10s. She has no history of heart disease. Sees cardiologist for her primary care.  Past Medical History  Diagnosis Date  . Hypercholesterolemia     Controlled by diet  . Hypertension     Controlled by medications  . Scoliosis     Hx of  . Palpitations     Hx of   Past Surgical History  Procedure Laterality Date  . US echocardiography  11-10-2005    EF 55-60%   Family History  Problem Relation  Age of Onset  . Heart disease Mother   . Coronary artery disease Mother   . Parkinsonism Father    Social History  Substance Use Topics  . Smoking status: Never Smoker   . Smokeless tobacco: None  . Alcohol Use: No   OB History    No data available     Review of Systems  Constitutional: Negative for fever and chills.  HENT: Negative for congestion and rhinorrhea.   Eyes: Negative for redness and visual disturbance.  Respiratory: Negative for shortness of breath and wheezing.   Cardiovascular: Positive for chest pain. Negative for palpitations.  Gastrointestinal: Negative for nausea and vomiting.  Genitourinary: Negative for dysuria and urgency.  Musculoskeletal: Negative for myalgias and arthralgias.  Skin: Negative for pallor and wound.  Neurological: Negative for dizziness and headaches.      Allergies  Codeine; Dipth, acell pertus,; Lipitor; and Procaine hcl  Home Medications   Prior to Admission medications   Medication Sig Start Date End Date Taking? Authorizing Provider  aspirin 81 MG tablet Take 81 mg by mouth daily.     Yes Historical Provider, MD  metoprolol (LOPRESSOR) 25 MG tablet Take 1 tablet (25 mg total) by mouth 2 (two) times daily. 11/13/14  Yes Cassell Clement, MD  Thiamine HCl (VITAMIN B-1) 50 MG tablet Take 50 mg by mouth daily.     Yes Historical Provider, MD   BP  135/90 mmHg  Pulse 76  Temp(Src) 98.6 F (37 C) (Oral)  Resp 19  Ht 5\' 1"  (1.549 m)  Wt 110 lb (49.896 kg)  BMI 20.80 kg/m2  SpO2 100% Physical Exam  Constitutional: She is oriented to person, place, and time. She appears well-developed and well-nourished. No distress.  HENT:  Head: Normocephalic and atraumatic.  Eyes: EOM are normal. Pupils are equal, round, and reactive to light.  Neck: Normal range of motion. Neck supple.  Cardiovascular: Normal rate and regular rhythm.  Exam reveals no gallop and no friction rub.   No murmur heard. Pulmonary/Chest: Effort normal. She has no  wheezes. She has no rales.  Abdominal: Soft. She exhibits no distension. There is no tenderness. There is no rebound and no guarding.  Musculoskeletal: She exhibits no edema or tenderness.  Neurological: She is alert and oriented to person, place, and time.  Skin: Skin is warm and dry. She is not diaphoretic.  Psychiatric: She has a normal mood and affect. Her behavior is normal.  Nursing note and vitals reviewed.   ED Course  Procedures (including critical care time) Labs Review Labs Reviewed  BASIC METABOLIC PANEL - Abnormal; Notable for the following:    Glucose, Bld 100 (*)    BUN 24 (*)    Creatinine, Ser 1.16 (*)    GFR calc non Af Amer 42 (*)    GFR calc Af Amer 48 (*)    All other components within normal limits  CBC  I-STAT TROPOININ, ED  I-STAT TROPOININ, ED    Imaging Review Dg Chest 2 View  11/09/2015  CLINICAL DATA:  LEFT side chest pain beginning this morning after home alarm went off, hypertension, scoliosis, palpitations EXAM: CHEST  2 VIEW COMPARISON:  None FINDINGS: Upper normal heart size. At atherosclerotic calcification aorta. Mediastinal contours and pulmonary vascularity normal for the severe degree of dextroconvex thoracolumbar scoliosis. Emphysematous and bronchitic changes question COPD. Subsegmental atelectasis LEFT base. Lungs otherwise clear. No pleural effusion or pneumothorax. Significant osseous demineralization. IMPRESSION: Question COPD changes with subsegmental atelectasis LEFT base. Electronically Signed   By: Ulyses SouthwardMark  Boles M.D.   On: 11/09/2015 11:42   I have personally reviewed and evaluated these images and lab results as part of my medical decision-making.   EKG Interpretation   Date/Time:  Friday Nov 09 2015 10:28:44 EDT Ventricular Rate:  66 PR Interval:  195 QRS Duration: 89 QT Interval:  398 QTC Calculation: 417 R Axis:   33 Text Interpretation:  Sinus rhythm Probable left atrial enlargement  Probable anterolateral infarct, old Abnrm  T, consider ischemia,  anterolateral lds No old tracing to compare Confirmed by Giulliana Mcroberts MD, DANIEL  [phone]([id number]) on 11/09/2015 10:44:06 AM Also confirmed by Adela LankFLOYD MD, DANIEL  [phone]), editor Stout CT, Jola BabinskiMarilyn [phone]  on 11/09/2015 11:45:25 AM      MDM   Final diagnoses:  Chest pain, unspecified chest pain type    82 yo F with chest pain.  No hx of CAD. No pain on arrival.  Atypical symptoms.  ECG with concern for hyperacute T waves.  No progression on repeat.  Discussed with cards, Dr. Herbie BaltimoreHarding feel these changes are likely not related, feel safe for delta trop.   Delta troponin is negative. The patient was a symptomatically throughout her stay here in the ED. We'll have her follow-up within 48 hours with cardiology she has an appointment in 3 days time already scheduled.   3:42 PM:  I have discussed the diagnosis/risks/treatment options with the  patient and family and believe the pt to be eligible for discharge home to follow-up with Cards. We also discussed returning to the ED immediately if new or worsening sx occur. We discussed the sx which are most concerning (e.g., sudden worsening pain, fever, inability to tolerate by mouth) that necessitate immediate return. Medications administered to the patient during their visit and any new prescriptions provided to the patient are listed below.  Medications given during this visit Medications - No data to display  New Prescriptions   No medications on file    The patient appears reasonably screen and/or stabilized for discharge and I doubt any other medical condition or other [facility] requiring further screening, evaluation, or treatment in the ED at this time prior to discharge.      Melene Plan, DO 11/09/15 1542

## 2015-11-09 NOTE — Discharge Instructions (Signed)
Return for sudden worsening pain or sob.   Nonspecific Chest Pain It is often hard to find the cause of chest pain. There is always a chance that your pain could be related to something serious, such as a heart attack or a blood clot in your lungs. Chest pain can also be caused by conditions that are not life-threatening. If you have chest pain, it is very important to follow up with your doctor.  HOME CARE  If you were prescribed an antibiotic medicine, finish it all even if you start to feel better.  Avoid any activities that cause chest pain.  Do not use any tobacco products, including cigarettes, chewing tobacco, or electronic cigarettes. If you need help quitting, ask your doctor.  Do not drink alcohol.  Take medicines only as told by your doctor.  Keep all follow-up visits as told by your doctor. This is important. This includes any further testing if your chest pain does not go away.  Your doctor may tell you to keep your head raised (elevated) while you sleep.  Make lifestyle changes as told by your doctor. These may include:  Getting regular exercise. Ask your doctor to suggest some activities that are safe for you.  Eating a heart-healthy diet. Your doctor or a diet specialist (dietitian) can help you to learn healthy eating options.  Maintaining a healthy weight.  Managing diabetes, if necessary.  Reducing stress. GET HELP IF:  Your chest pain does not go away, even after treatment.  You have a rash with blisters on your chest.  You have a fever. GET HELP RIGHT AWAY IF:  Your chest pain is worse.  You have an increasing cough, or you cough up blood.  You have severe belly (abdominal) pain.  You feel extremely weak.  You pass out (faint).  You have chills.  You have sudden, unexplained chest discomfort.  You have sudden, unexplained discomfort in your arms, back, neck, or jaw.  You have shortness of breath at any time.  You suddenly start to sweat,  or your skin gets clammy.  You feel nauseous.  You vomit.  You suddenly feel light-headed or dizzy.  Your heart begins to beat quickly, or it feels like it is skipping beats. These symptoms may be an emergency. Do not wait to see if the symptoms will go away. Get medical help right away. Call your local emergency services (911 in the [place] Do not drive yourself to the hospital.   This information is not intended to replace advice given to you by your health care provider. Make sure you discuss any questions you have with your health care provider.   Document Released: 11/26/2007 Document Revised: 06/30/2014 Document Reviewed: 01/13/2014 Elsevier Interactive Patient Education [employer]2016 Elsevier Inc.

## 2015-11-09 NOTE — Telephone Encounter (Signed)
New Message    pt request call from RN.  PT C/o left side chest pain. Please call back and discuss.   Pt c/o of Chest Pain: STAT if CP now or developed within 24 hours  1. Are you having CP right now? Almost gone   2. Are you experiencing any other symptoms (ex. SOB, nausea, vomiting, sweating)? no  3. How long have you been experiencing CP? 8am aftwer alarm went off in her home  4. Is your CP continuous or coming and going? constant  5. Have you taken Nitroglycerin? no ?

## 2015-11-09 NOTE — Telephone Encounter (Signed)
Pt states home alarm went off at 8AM this morning and she began having CP in center of chest that radiates to left side. Pt denies dizziness, lightheadedness, and SOB. CP has improved some but it still occuring at this time. Pt does not have any nitro to take. Vitals are 200/99, HR 75. Advised pt to proceed to ED for evaluation. Advised pt not to drive.  She states she will call her daughter and see if she can take her and if she is unable she will call 911. Will route to Norma FredricksonLori Gerhardt, NP to make her aware.

## 2015-11-13 ENCOUNTER — Ambulatory Visit (INDEPENDENT_AMBULATORY_CARE_PROVIDER_SITE_OTHER): Payer: Medicare Other | Admitting: Nurse Practitioner

## 2015-11-13 ENCOUNTER — Encounter: Payer: Self-pay | Admitting: Nurse Practitioner

## 2015-11-13 ENCOUNTER — Other Ambulatory Visit: Payer: Medicare Other

## 2015-11-13 VITALS — BP 170/90 | HR 60 | Ht 60.0 in | Wt 115.1 lb

## 2015-11-13 DIAGNOSIS — E7800 Pure hypercholesterolemia, unspecified: Secondary | ICD-10-CM | POA: Diagnosis not present

## 2015-11-13 DIAGNOSIS — R0789 Other chest pain: Secondary | ICD-10-CM | POA: Diagnosis not present

## 2015-11-13 DIAGNOSIS — M419 Scoliosis, unspecified: Secondary | ICD-10-CM | POA: Diagnosis not present

## 2015-11-13 DIAGNOSIS — I119 Hypertensive heart disease without heart failure: Secondary | ICD-10-CM

## 2015-11-13 LAB — HEPATIC FUNCTION PANEL
ALT: 17 U/L (ref 6–29)
AST: 32 U/L (ref 10–35)
Albumin: 4 g/dL (ref 3.6–5.1)
Alkaline Phosphatase: 61 U/L (ref 33–130)
Bilirubin, Direct: 0.2 mg/dL (ref <=0.2)
Indirect Bilirubin: 0.6 mg/dL (ref 0.2–1.2)
Total Bilirubin: 0.8 mg/dL (ref 0.2–1.2)
Total Protein: 6.6 g/dL (ref 6.1–8.1)

## 2015-11-13 LAB — LIPID PANEL
Cholesterol: 185 mg/dL (ref 125–200)
HDL: 90 mg/dL (ref >=46)
LDL Cholesterol: 83 mg/dL (ref <130)
Total CHOL/HDL Ratio: 2.1 Ratio (ref <=5.0)
Triglycerides: 60 mg/dL (ref <150)
VLDL: 12 mg/dL (ref <30)

## 2015-11-13 NOTE — Progress Notes (Signed)
CARDIOLOGY OFFICE NOTE  Date:  11/13/2015    Catherine Frost Date of Birth: 1928/06/16 Medical Record #[id number]  PCP:  No PCP Per Patient  Cardiologist:  Former patient of Dr. Patty Sermons - to follow with     Chief Complaint  Patient presents with  . Chest Pain  . Hypertension  . Hyperlipidemia    6 month/post ER visit - former patient of Dr. Yevonne Pax    History of Present Illness: Catherine Frost is a 82 y.o. female who presents today for a 6 month/post ER visit. Former patient of Dr. Patty Sermons.   She has a history of essential hypertension and history of hypercholesterolemia. She also has severe scoliosis. She has no known CAD.   Last seen in November and was felt to be doing ok.  In the ER over the weekend with chest pain. Had been having problems with her home alarm system - this distressed her. Once resolved, she went to sit down and had pinpoint tenderness in the left side of her chest. Troponins negative.   Comes in today. Here with her daughter. History is from both the patient and daughter. Apparently has not been feeling well a few weeks prior to this ER visit. More fatigue and some DOE. She has back pain. She has significant scoliosis - asking about what type of garment/brace to wear. Seen ortho "a long time ago". Occasionally checks BP at home. No swelling. No real palpitations. Has not had anymore chest pain.   Past Medical History  Diagnosis Date  . Hypercholesterolemia     Controlled by diet  . Hypertension     Controlled by medications  . Scoliosis     Hx of  . Palpitations     Hx of    Past Surgical History  Procedure Laterality Date  . US echocardiography  11-10-2005    EF 55-60%     Medications: Current Outpatient Prescriptions  Medication Sig Dispense Refill  . aspirin 81 MG tablet Take 81 mg by mouth daily.      . metoprolol (LOPRESSOR) 25 MG tablet Take 1 tablet (25 mg total) by mouth 2 (two) times daily. 180 tablet 3  . Thiamine HCl  (VITAMIN B-1) 50 MG tablet Take 50 mg by mouth daily.       No current facility-administered medications for this visit.    Allergies: Allergies  Allergen Reactions  . Codeine Nausea And Vomiting  . Dipth, Acell Pertus, [Tetanus-Diphth-Acell Pertussis]     Allergic to diptheria  . Lipitor [Atorvastatin Calcium]     Fatigue  . Procaine Hcl     unknown    Social History: The patient  reports that she has never smoked. She does not have any smokeless tobacco history on file. She reports that she does not drink alcohol.   Family History: The patient's family history includes Coronary artery disease in her mother; Heart disease in her mother; Parkinsonism in her father.   Review of Systems: Please see the history of present illness.   Otherwise, the review of systems is positive for none.   All other systems are reviewed and negative.   Physical Exam: VS:  BP 170/90 mmHg  Pulse 60  Ht 5' (1.524 m)  Wt 115 lb 1.9 oz (52.218 kg)  BMI 22.48 kg/m2 .  BMI Body mass index is 22.48 kg/(m^2).  Wt Readings from Last 3 Encounters:  11/13/15 115 lb 1.9 oz (52.218 kg)  11/09/15 110 lb (49.896 kg)  05/16/15 112 lb (50.803 kg)     General: Elderly female who is alert and in no acute distress.  HEENT: Normal. Neck: Supple, no JVD, carotid bruits, or masses noted.  Cardiac: Regular rate and rhythm. No murmurs, rubs, or gallops. No edema.  Respiratory:  Lungs are clear to auscultation bilaterally with normal work of breathing.  GI: Soft and nontender.  MS: Marked scoliosis of the spine noted.  Gait and ROM intact. Skin: Warm and dry. Color is normal.  Neuro:  Strength and sensation are intact and no gross focal deficits noted.  Psych: Alert, appropriate and with normal affect.   LABORATORY DATA:  EKG:  EKG is not ordered today.  Lab Results  Component Value Date   WBC 5.6 11/09/2015   HGB 12.4 11/09/2015   HCT 37.7 11/09/2015   PLT 164 11/09/2015   GLUCOSE 100* 11/09/2015    CHOL 174 05/09/2015   TRIG 52 05/09/2015   HDL 86 05/09/2015   LDLDIRECT 110.9 03/24/2012   LDLCALC 78 05/09/2015   ALT 17 05/09/2015   AST 30 05/09/2015   NA 141 11/09/2015   K 4.3 11/09/2015   CL 106 11/09/2015   CREATININE 1.16* 11/09/2015   BUN 24* 11/09/2015   CO2 26 11/09/2015    BNP (last 3 results) No results for input(s): BNP in the last 8760 hours.  ProBNP (last 3 results) No results for input(s): PROBNP in the last 8760 hours.   Other Studies Reviewed Today:   Assessment/Plan: 1. Atypical chest pain - possibly stress related but endorses DOE/fatigue prior to this event. Have recommended Lexiscan Myoview - she wishes to go home and think about this. Have recommended follow up in a month or two to see how she is doing - she is not ready to consider another physician here. Apparently Dr. Verl DickerGanji's name has been mentioned to her. She is to let us know how she would like to proceed going forward.   2. HTN - little better on recheck - encouraged her to check at home.   3. HLD - lab today.   4. General health maintenance - needs to establish with primary care. Daughter [facility] and will try to establish there.   5. Scoliosis - asking me about back braces/garments to wear - this is out of my scope. Would refer back to orthopedics.   Current medicines are reviewed with the patient today.  The patient does not have concerns regarding medicines other than what has been noted above.  The following changes have been made:  See above.  Labs/ tests ordered today include:    Orders Placed This Encounter  Procedures  . Hepatic function panel  . Lipid panel  . Ambulatory referral to Internal Medicine     Disposition:   Currently has no follow up back in this office. Patient is to be in contact with us once she decides. Have explained that this is not ideal. Referring to orthopedics.    Patient is agreeable to this plan and will call if any problems  develop in the interim.   Signed: Rosalio MacadamiaLori C. Georgiann Neider, RN, ANP-C 11/13/2015 8:22 AM  [facility] [address] [place]  [address] Phone: [phone] Fax: [phone]

## 2015-11-13 NOTE — Patient Instructions (Addendum)
We will be checking the following labs today - HPF and lipids   Medication Instructions:    Continue with your current medicines.     Testing/Procedures To Be Arranged:  I have recommended a Lexiscan stress test  Follow-Up:   See us back in about 1 to 2 months for a recheck - let us know if you would like this to be scheduled  [facility]    Other Special Instructions:   N/A    If you need a refill on your cardiac medications before your next appointment, please call your pharmacy.   Call the [facility] office at [phone] if you have any questions, problems or concerns.

## 2015-11-21 DIAGNOSIS — M4120 Other idiopathic scoliosis, site unspecified: Secondary | ICD-10-CM | POA: Diagnosis not present

## 2015-12-04 ENCOUNTER — Other Ambulatory Visit: Payer: Self-pay

## 2015-12-04 DIAGNOSIS — M4134 Thoracogenic scoliosis, thoracic region: Secondary | ICD-10-CM | POA: Diagnosis not present

## 2015-12-04 MED ORDER — METOPROLOL TARTRATE 25 MG PO TABS: 25.0000 mg | ORAL_TABLET | Freq: Two times a day (BID) | ORAL | Status: AC

## 2015-12-05 DIAGNOSIS — E784 Other hyperlipidemia: Secondary | ICD-10-CM | POA: Diagnosis not present

## 2015-12-05 DIAGNOSIS — I10 Essential (primary) hypertension: Secondary | ICD-10-CM | POA: Diagnosis not present

## 2015-12-05 DIAGNOSIS — Z6838 Body mass index (BMI) 38.0-38.9, adult: Secondary | ICD-10-CM | POA: Diagnosis not present

## 2015-12-05 DIAGNOSIS — M419 Scoliosis, unspecified: Secondary | ICD-10-CM | POA: Diagnosis not present

## 2015-12-07 DIAGNOSIS — I700 Atherosclerosis of aorta: Secondary | ICD-10-CM | POA: Diagnosis not present

## 2015-12-07 DIAGNOSIS — I10 Essential (primary) hypertension: Secondary | ICD-10-CM | POA: Diagnosis not present

## 2015-12-07 DIAGNOSIS — R0609 Other forms of dyspnea: Secondary | ICD-10-CM | POA: Diagnosis not present

## 2015-12-07 DIAGNOSIS — E7800 Pure hypercholesterolemia, unspecified: Secondary | ICD-10-CM | POA: Diagnosis not present

## 2015-12-14 DIAGNOSIS — R0602 Shortness of breath: Secondary | ICD-10-CM | POA: Diagnosis not present

## 2015-12-18 DIAGNOSIS — R0602 Shortness of breath: Secondary | ICD-10-CM | POA: Diagnosis not present

## 2015-12-31 DIAGNOSIS — R0609 Other forms of dyspnea: Secondary | ICD-10-CM | POA: Diagnosis not present

## 2015-12-31 DIAGNOSIS — E7800 Pure hypercholesterolemia, unspecified: Secondary | ICD-10-CM | POA: Diagnosis not present

## 2015-12-31 DIAGNOSIS — I700 Atherosclerosis of aorta: Secondary | ICD-10-CM | POA: Diagnosis not present

## 2015-12-31 DIAGNOSIS — I10 Essential (primary) hypertension: Secondary | ICD-10-CM | POA: Diagnosis not present

## 2016-01-10 DIAGNOSIS — I10 Essential (primary) hypertension: Secondary | ICD-10-CM | POA: Diagnosis not present

## 2016-01-17 ENCOUNTER — Other Ambulatory Visit: Payer: Self-pay | Admitting: Internal Medicine

## 2016-01-17 DIAGNOSIS — Z1231 Encounter for screening mammogram for malignant neoplasm of breast: Secondary | ICD-10-CM

## 2016-01-21 DIAGNOSIS — M4134 Thoracogenic scoliosis, thoracic region: Secondary | ICD-10-CM | POA: Diagnosis not present

## 2016-01-24 ENCOUNTER — Ambulatory Visit
Admission: RE | Admit: 2016-01-24 | Discharge: 2016-01-24 | Disposition: A | Discharge status: Home / Self Care | Payer: Medicare Other | Source: Ambulatory Visit | Attending: Internal Medicine | Admitting: Internal Medicine

## 2016-01-24 DIAGNOSIS — Z1231 Encounter for screening mammogram for malignant neoplasm of breast: Secondary | ICD-10-CM

## 2016-01-24 DIAGNOSIS — M4134 Thoracogenic scoliosis, thoracic region: Secondary | ICD-10-CM | POA: Diagnosis not present

## 2016-01-28 DIAGNOSIS — I10 Essential (primary) hypertension: Secondary | ICD-10-CM | POA: Diagnosis not present

## 2016-01-28 DIAGNOSIS — E7800 Pure hypercholesterolemia, unspecified: Secondary | ICD-10-CM | POA: Diagnosis not present

## 2016-01-28 DIAGNOSIS — R0609 Other forms of dyspnea: Secondary | ICD-10-CM | POA: Diagnosis not present

## 2016-01-28 DIAGNOSIS — I700 Atherosclerosis of aorta: Secondary | ICD-10-CM | POA: Diagnosis not present

## 2016-01-29 DIAGNOSIS — M4134 Thoracogenic scoliosis, thoracic region: Secondary | ICD-10-CM | POA: Diagnosis not present

## 2016-01-31 DIAGNOSIS — M4134 Thoracogenic scoliosis, thoracic region: Secondary | ICD-10-CM | POA: Diagnosis not present

## 2016-02-05 DIAGNOSIS — M4120 Other idiopathic scoliosis, site unspecified: Secondary | ICD-10-CM | POA: Diagnosis not present

## 2016-02-06 DIAGNOSIS — Z01419 Encounter for gynecological examination (general) (routine) without abnormal findings: Secondary | ICD-10-CM | POA: Diagnosis not present

## 2016-02-25 DIAGNOSIS — Z23 Encounter for immunization: Secondary | ICD-10-CM | POA: Diagnosis not present

## 2016-02-29 DIAGNOSIS — I10 Essential (primary) hypertension: Secondary | ICD-10-CM | POA: Diagnosis not present

## 2016-02-29 DIAGNOSIS — E784 Other hyperlipidemia: Secondary | ICD-10-CM | POA: Diagnosis not present

## 2016-03-04 DIAGNOSIS — H52203 Unspecified astigmatism, bilateral: Secondary | ICD-10-CM | POA: Diagnosis not present

## 2016-03-04 DIAGNOSIS — H04123 Dry eye syndrome of bilateral lacrimal glands: Secondary | ICD-10-CM | POA: Diagnosis not present

## 2016-03-04 DIAGNOSIS — H43813 Vitreous degeneration, bilateral: Secondary | ICD-10-CM | POA: Diagnosis not present

## 2016-03-04 DIAGNOSIS — H2512 Age-related nuclear cataract, left eye: Secondary | ICD-10-CM | POA: Diagnosis not present

## 2016-03-07 DIAGNOSIS — Z6825 Body mass index (BMI) 25.0-25.9, adult: Secondary | ICD-10-CM | POA: Diagnosis not present

## 2016-03-07 DIAGNOSIS — Z1389 Encounter for screening for other disorder: Secondary | ICD-10-CM | POA: Diagnosis not present

## 2016-03-07 DIAGNOSIS — I700 Atherosclerosis of aorta: Secondary | ICD-10-CM | POA: Diagnosis not present

## 2016-03-07 DIAGNOSIS — Z0000 Encounter for general adult medical examination without abnormal findings: Secondary | ICD-10-CM | POA: Diagnosis not present

## 2016-03-07 DIAGNOSIS — I272 Other secondary pulmonary hypertension: Secondary | ICD-10-CM | POA: Diagnosis not present

## 2016-03-07 DIAGNOSIS — I10 Essential (primary) hypertension: Secondary | ICD-10-CM | POA: Diagnosis not present

## 2016-03-07 DIAGNOSIS — E784 Other hyperlipidemia: Secondary | ICD-10-CM | POA: Diagnosis not present

## 2016-03-07 DIAGNOSIS — M419 Scoliosis, unspecified: Secondary | ICD-10-CM | POA: Diagnosis not present

## 2016-09-02 DIAGNOSIS — H25012 Cortical age-related cataract, left eye: Secondary | ICD-10-CM | POA: Diagnosis not present

## 2016-09-02 DIAGNOSIS — H04123 Dry eye syndrome of bilateral lacrimal glands: Secondary | ICD-10-CM | POA: Diagnosis not present

## 2016-09-02 DIAGNOSIS — H2512 Age-related nuclear cataract, left eye: Secondary | ICD-10-CM | POA: Diagnosis not present

## 2016-09-02 DIAGNOSIS — H349 Unspecified retinal vascular occlusion: Secondary | ICD-10-CM | POA: Diagnosis not present

## 2016-09-05 DIAGNOSIS — Z6825 Body mass index (BMI) 25.0-25.9, adult: Secondary | ICD-10-CM | POA: Diagnosis not present

## 2016-09-05 DIAGNOSIS — Z1389 Encounter for screening for other disorder: Secondary | ICD-10-CM | POA: Diagnosis not present

## 2016-09-05 DIAGNOSIS — I10 Essential (primary) hypertension: Secondary | ICD-10-CM | POA: Diagnosis not present

## 2016-09-05 DIAGNOSIS — M419 Scoliosis, unspecified: Secondary | ICD-10-CM | POA: Diagnosis not present

## 2016-12-22 DIAGNOSIS — M25562 Pain in left knee: Secondary | ICD-10-CM | POA: Diagnosis not present

## 2016-12-22 DIAGNOSIS — Z6822 Body mass index (BMI) 22.0-22.9, adult: Secondary | ICD-10-CM | POA: Diagnosis not present

## 2017-01-20 ENCOUNTER — Other Ambulatory Visit: Payer: Self-pay | Admitting: Internal Medicine

## 2017-01-20 DIAGNOSIS — Z1231 Encounter for screening mammogram for malignant neoplasm of breast: Secondary | ICD-10-CM

## 2017-02-06 ENCOUNTER — Ambulatory Visit
Admission: RE | Admit: 2017-02-06 | Discharge: 2017-02-06 | Disposition: A | Discharge status: Home / Self Care | Payer: Medicare Other | Source: Ambulatory Visit | Attending: Internal Medicine | Admitting: Internal Medicine

## 2017-02-06 DIAGNOSIS — Z1231 Encounter for screening mammogram for malignant neoplasm of breast: Secondary | ICD-10-CM

## 2017-03-10 DIAGNOSIS — I10 Essential (primary) hypertension: Secondary | ICD-10-CM | POA: Diagnosis not present

## 2017-03-10 DIAGNOSIS — H25012 Cortical age-related cataract, left eye: Secondary | ICD-10-CM | POA: Diagnosis not present

## 2017-03-10 DIAGNOSIS — H52203 Unspecified astigmatism, bilateral: Secondary | ICD-10-CM | POA: Diagnosis not present

## 2017-03-10 DIAGNOSIS — H2512 Age-related nuclear cataract, left eye: Secondary | ICD-10-CM | POA: Diagnosis not present

## 2017-03-10 DIAGNOSIS — H349 Unspecified retinal vascular occlusion: Secondary | ICD-10-CM | POA: Diagnosis not present

## 2017-03-10 DIAGNOSIS — E784 Other hyperlipidemia: Secondary | ICD-10-CM | POA: Diagnosis not present

## 2017-03-11 DIAGNOSIS — R946 Abnormal results of thyroid function studies: Secondary | ICD-10-CM | POA: Diagnosis not present

## 2017-03-16 DIAGNOSIS — Z23 Encounter for immunization: Secondary | ICD-10-CM | POA: Diagnosis not present

## 2017-03-16 DIAGNOSIS — M25562 Pain in left knee: Secondary | ICD-10-CM | POA: Diagnosis not present

## 2017-03-16 DIAGNOSIS — I700 Atherosclerosis of aorta: Secondary | ICD-10-CM | POA: Diagnosis not present

## 2017-03-16 DIAGNOSIS — M419 Scoliosis, unspecified: Secondary | ICD-10-CM | POA: Diagnosis not present

## 2017-03-16 DIAGNOSIS — N183 Chronic kidney disease, stage 3 (moderate): Secondary | ICD-10-CM | POA: Diagnosis not present

## 2017-03-16 DIAGNOSIS — Z0000 Encounter for general adult medical examination without abnormal findings: Secondary | ICD-10-CM | POA: Diagnosis not present

## 2017-03-16 DIAGNOSIS — R808 Other proteinuria: Secondary | ICD-10-CM | POA: Diagnosis not present

## 2017-03-16 DIAGNOSIS — I2789 Other specified pulmonary heart diseases: Secondary | ICD-10-CM | POA: Diagnosis not present

## 2017-03-16 DIAGNOSIS — I10 Essential (primary) hypertension: Secondary | ICD-10-CM | POA: Diagnosis not present

## 2017-03-16 DIAGNOSIS — Z780 Asymptomatic menopausal state: Secondary | ICD-10-CM | POA: Diagnosis not present

## 2017-03-16 DIAGNOSIS — E784 Other hyperlipidemia: Secondary | ICD-10-CM | POA: Diagnosis not present

## 2017-03-16 DIAGNOSIS — Z6822 Body mass index (BMI) 22.0-22.9, adult: Secondary | ICD-10-CM | POA: Diagnosis not present

## 2017-03-16 DIAGNOSIS — Z1389 Encounter for screening for other disorder: Secondary | ICD-10-CM | POA: Diagnosis not present

## 2017-05-26 DIAGNOSIS — Z6822 Body mass index (BMI) 22.0-22.9, adult: Secondary | ICD-10-CM | POA: Diagnosis not present

## 2017-05-26 DIAGNOSIS — M810 Age-related osteoporosis without current pathological fracture: Secondary | ICD-10-CM | POA: Diagnosis not present

## 2017-06-03 DIAGNOSIS — Z6822 Body mass index (BMI) 22.0-22.9, adult: Secondary | ICD-10-CM | POA: Diagnosis not present

## 2017-06-03 DIAGNOSIS — N183 Chronic kidney disease, stage 3 (moderate): Secondary | ICD-10-CM | POA: Diagnosis not present

## 2017-06-03 DIAGNOSIS — E7849 Other hyperlipidemia: Secondary | ICD-10-CM | POA: Diagnosis not present

## 2017-06-03 DIAGNOSIS — E875 Hyperkalemia: Secondary | ICD-10-CM | POA: Diagnosis not present

## 2017-06-03 DIAGNOSIS — I10 Essential (primary) hypertension: Secondary | ICD-10-CM | POA: Diagnosis not present

## 2017-06-03 DIAGNOSIS — M810 Age-related osteoporosis without current pathological fracture: Secondary | ICD-10-CM | POA: Diagnosis not present

## 2018-01-19 ENCOUNTER — Other Ambulatory Visit: Payer: Self-pay | Admitting: Internal Medicine

## 2018-01-19 DIAGNOSIS — Z139 Encounter for screening, unspecified: Secondary | ICD-10-CM

## 2018-02-17 ENCOUNTER — Ambulatory Visit
Admission: RE | Admit: 2018-02-17 | Discharge: 2018-02-17 | Disposition: A | Discharge status: Home / Self Care | Payer: Medicare Other | Source: Ambulatory Visit | Attending: Internal Medicine | Admitting: Internal Medicine

## 2018-02-17 DIAGNOSIS — Z139 Encounter for screening, unspecified: Secondary | ICD-10-CM

## 2018-02-17 DIAGNOSIS — Z1231 Encounter for screening mammogram for malignant neoplasm of breast: Secondary | ICD-10-CM | POA: Diagnosis not present

## 2018-03-10 DIAGNOSIS — R946 Abnormal results of thyroid function studies: Secondary | ICD-10-CM | POA: Diagnosis not present

## 2018-03-10 DIAGNOSIS — E7849 Other hyperlipidemia: Secondary | ICD-10-CM | POA: Diagnosis not present

## 2018-03-10 DIAGNOSIS — M810 Age-related osteoporosis without current pathological fracture: Secondary | ICD-10-CM | POA: Diagnosis not present

## 2018-03-10 DIAGNOSIS — R82998 Other abnormal findings in urine: Secondary | ICD-10-CM | POA: Diagnosis not present

## 2018-03-10 DIAGNOSIS — R808 Other proteinuria: Secondary | ICD-10-CM | POA: Diagnosis not present

## 2018-03-11 DIAGNOSIS — Z23 Encounter for immunization: Secondary | ICD-10-CM | POA: Diagnosis not present

## 2018-03-16 DIAGNOSIS — H52203 Unspecified astigmatism, bilateral: Secondary | ICD-10-CM | POA: Diagnosis not present

## 2018-03-16 DIAGNOSIS — H2512 Age-related nuclear cataract, left eye: Secondary | ICD-10-CM | POA: Diagnosis not present

## 2018-03-16 DIAGNOSIS — H04123 Dry eye syndrome of bilateral lacrimal glands: Secondary | ICD-10-CM | POA: Diagnosis not present

## 2018-03-16 DIAGNOSIS — H25012 Cortical age-related cataract, left eye: Secondary | ICD-10-CM | POA: Diagnosis not present

## 2018-03-17 DIAGNOSIS — I2789 Other specified pulmonary heart diseases: Secondary | ICD-10-CM | POA: Diagnosis not present

## 2018-03-17 DIAGNOSIS — I129 Hypertensive chronic kidney disease with stage 1 through stage 4 chronic kidney disease, or unspecified chronic kidney disease: Secondary | ICD-10-CM | POA: Diagnosis not present

## 2018-03-17 DIAGNOSIS — M810 Age-related osteoporosis without current pathological fracture: Secondary | ICD-10-CM | POA: Diagnosis not present

## 2018-03-17 DIAGNOSIS — Z1389 Encounter for screening for other disorder: Secondary | ICD-10-CM | POA: Diagnosis not present

## 2018-03-17 DIAGNOSIS — Z0000 Encounter for general adult medical examination without abnormal findings: Secondary | ICD-10-CM | POA: Diagnosis not present

## 2018-03-17 DIAGNOSIS — M419 Scoliosis, unspecified: Secondary | ICD-10-CM | POA: Diagnosis not present

## 2018-03-17 DIAGNOSIS — Z6821 Body mass index (BMI) 21.0-21.9, adult: Secondary | ICD-10-CM | POA: Diagnosis not present

## 2018-03-17 DIAGNOSIS — E871 Hypo-osmolality and hyponatremia: Secondary | ICD-10-CM | POA: Diagnosis not present

## 2018-03-17 DIAGNOSIS — D692 Other nonthrombocytopenic purpura: Secondary | ICD-10-CM | POA: Diagnosis not present

## 2018-03-17 DIAGNOSIS — I10 Essential (primary) hypertension: Secondary | ICD-10-CM | POA: Diagnosis not present

## 2018-03-17 DIAGNOSIS — E7849 Other hyperlipidemia: Secondary | ICD-10-CM | POA: Diagnosis not present

## 2018-04-06 DIAGNOSIS — I10 Essential (primary) hypertension: Secondary | ICD-10-CM | POA: Diagnosis not present

## 2018-04-06 DIAGNOSIS — Z6822 Body mass index (BMI) 22.0-22.9, adult: Secondary | ICD-10-CM | POA: Diagnosis not present

## 2018-04-06 DIAGNOSIS — S60911A Unspecified superficial injury of right wrist, initial encounter: Secondary | ICD-10-CM | POA: Diagnosis not present

## 2018-04-06 DIAGNOSIS — R600 Localized edema: Secondary | ICD-10-CM | POA: Diagnosis not present

## 2018-08-11 DIAGNOSIS — Z6822 Body mass index (BMI) 22.0-22.9, adult: Secondary | ICD-10-CM | POA: Diagnosis not present

## 2018-08-11 DIAGNOSIS — I10 Essential (primary) hypertension: Secondary | ICD-10-CM | POA: Diagnosis not present

## 2018-08-11 DIAGNOSIS — E871 Hypo-osmolality and hyponatremia: Secondary | ICD-10-CM | POA: Diagnosis not present

## 2018-08-11 DIAGNOSIS — N183 Chronic kidney disease, stage 3 (moderate): Secondary | ICD-10-CM | POA: Diagnosis not present

## 2018-08-11 DIAGNOSIS — E875 Hyperkalemia: Secondary | ICD-10-CM | POA: Diagnosis not present

## 2019-02-07 DIAGNOSIS — Z23 Encounter for immunization: Secondary | ICD-10-CM | POA: Diagnosis not present

## 2019-03-15 DIAGNOSIS — M810 Age-related osteoporosis without current pathological fracture: Secondary | ICD-10-CM | POA: Diagnosis not present

## 2019-03-15 DIAGNOSIS — E7849 Other hyperlipidemia: Secondary | ICD-10-CM | POA: Diagnosis not present

## 2019-03-15 DIAGNOSIS — R946 Abnormal results of thyroid function studies: Secondary | ICD-10-CM | POA: Diagnosis not present

## 2019-03-17 DIAGNOSIS — I10 Essential (primary) hypertension: Secondary | ICD-10-CM | POA: Diagnosis not present

## 2019-03-17 DIAGNOSIS — R82998 Other abnormal findings in urine: Secondary | ICD-10-CM | POA: Diagnosis not present

## 2019-03-22 ENCOUNTER — Other Ambulatory Visit: Payer: Self-pay | Admitting: Internal Medicine

## 2019-03-22 DIAGNOSIS — N183 Chronic kidney disease, stage 3 (moderate): Secondary | ICD-10-CM | POA: Diagnosis not present

## 2019-03-22 DIAGNOSIS — D692 Other nonthrombocytopenic purpura: Secondary | ICD-10-CM | POA: Diagnosis not present

## 2019-03-22 DIAGNOSIS — R600 Localized edema: Secondary | ICD-10-CM | POA: Diagnosis not present

## 2019-03-22 DIAGNOSIS — Z1331 Encounter for screening for depression: Secondary | ICD-10-CM | POA: Diagnosis not present

## 2019-03-22 DIAGNOSIS — M810 Age-related osteoporosis without current pathological fracture: Secondary | ICD-10-CM | POA: Diagnosis not present

## 2019-03-22 DIAGNOSIS — R809 Proteinuria, unspecified: Secondary | ICD-10-CM | POA: Diagnosis not present

## 2019-03-22 DIAGNOSIS — M419 Scoliosis, unspecified: Secondary | ICD-10-CM | POA: Diagnosis not present

## 2019-03-22 DIAGNOSIS — Z0000 Encounter for general adult medical examination without abnormal findings: Secondary | ICD-10-CM | POA: Diagnosis not present

## 2019-03-22 DIAGNOSIS — I129 Hypertensive chronic kidney disease with stage 1 through stage 4 chronic kidney disease, or unspecified chronic kidney disease: Secondary | ICD-10-CM | POA: Diagnosis not present

## 2019-03-22 DIAGNOSIS — E7849 Other hyperlipidemia: Secondary | ICD-10-CM | POA: Diagnosis not present

## 2019-03-22 DIAGNOSIS — I700 Atherosclerosis of aorta: Secondary | ICD-10-CM | POA: Diagnosis not present

## 2019-03-22 DIAGNOSIS — Z1231 Encounter for screening mammogram for malignant neoplasm of breast: Secondary | ICD-10-CM

## 2019-05-06 ENCOUNTER — Ambulatory Visit: Payer: Medicare Other

## 2019-09-28 DIAGNOSIS — I129 Hypertensive chronic kidney disease with stage 1 through stage 4 chronic kidney disease, or unspecified chronic kidney disease: Secondary | ICD-10-CM | POA: Diagnosis not present

## 2019-09-28 DIAGNOSIS — N1832 Chronic kidney disease, stage 3b: Secondary | ICD-10-CM | POA: Diagnosis not present

## 2019-10-06 ENCOUNTER — Ambulatory Visit
Admission: RE | Admit: 2019-10-06 | Discharge: 2019-10-06 | Disposition: A | Discharge status: Home / Self Care | Payer: Medicare Other | Source: Ambulatory Visit | Attending: Internal Medicine | Admitting: Internal Medicine

## 2019-10-06 ENCOUNTER — Other Ambulatory Visit: Payer: Self-pay

## 2019-10-06 DIAGNOSIS — Z1231 Encounter for screening mammogram for malignant neoplasm of breast: Secondary | ICD-10-CM

## 2020-03-08 ENCOUNTER — Other Ambulatory Visit: Payer: Self-pay

## 2020-03-08 ENCOUNTER — Ambulatory Visit (HOSPITAL_COMMUNITY)
Admission: RE | Admit: 2020-03-08 | Discharge: 2020-03-08 | Disposition: A | Discharge status: Home / Self Care | Payer: Medicare Other | Source: Ambulatory Visit | Attending: Vascular Surgery | Admitting: Vascular Surgery

## 2020-03-08 ENCOUNTER — Other Ambulatory Visit (HOSPITAL_COMMUNITY): Payer: Self-pay | Admitting: Family Medicine

## 2020-03-08 DIAGNOSIS — M79672 Pain in left foot: Secondary | ICD-10-CM | POA: Diagnosis not present

## 2020-03-08 DIAGNOSIS — R600 Localized edema: Secondary | ICD-10-CM

## 2020-03-08 DIAGNOSIS — W19XXXA Unspecified fall, initial encounter: Secondary | ICD-10-CM | POA: Diagnosis not present

## 2020-03-21 DIAGNOSIS — H524 Presbyopia: Secondary | ICD-10-CM | POA: Diagnosis not present

## 2020-03-21 DIAGNOSIS — H2512 Age-related nuclear cataract, left eye: Secondary | ICD-10-CM | POA: Diagnosis not present

## 2020-03-21 DIAGNOSIS — H04123 Dry eye syndrome of bilateral lacrimal glands: Secondary | ICD-10-CM | POA: Diagnosis not present

## 2020-03-21 DIAGNOSIS — H43813 Vitreous degeneration, bilateral: Secondary | ICD-10-CM | POA: Diagnosis not present

## 2020-03-29 DIAGNOSIS — Z23 Encounter for immunization: Secondary | ICD-10-CM | POA: Diagnosis not present

## 2020-03-30 DIAGNOSIS — D649 Anemia, unspecified: Secondary | ICD-10-CM | POA: Diagnosis not present

## 2020-03-30 DIAGNOSIS — R946 Abnormal results of thyroid function studies: Secondary | ICD-10-CM | POA: Diagnosis not present

## 2020-03-30 DIAGNOSIS — R202 Paresthesia of skin: Secondary | ICD-10-CM | POA: Diagnosis not present

## 2020-03-30 DIAGNOSIS — E785 Hyperlipidemia, unspecified: Secondary | ICD-10-CM | POA: Diagnosis not present

## 2020-03-30 DIAGNOSIS — M810 Age-related osteoporosis without current pathological fracture: Secondary | ICD-10-CM | POA: Diagnosis not present

## 2020-03-30 DIAGNOSIS — I10 Essential (primary) hypertension: Secondary | ICD-10-CM | POA: Diagnosis not present

## 2020-04-02 DIAGNOSIS — M419 Scoliosis, unspecified: Secondary | ICD-10-CM | POA: Diagnosis not present

## 2020-04-02 DIAGNOSIS — D692 Other nonthrombocytopenic purpura: Secondary | ICD-10-CM | POA: Diagnosis not present

## 2020-04-02 DIAGNOSIS — R82998 Other abnormal findings in urine: Secondary | ICD-10-CM | POA: Diagnosis not present

## 2020-04-02 DIAGNOSIS — Z23 Encounter for immunization: Secondary | ICD-10-CM | POA: Diagnosis not present

## 2020-04-02 DIAGNOSIS — M810 Age-related osteoporosis without current pathological fracture: Secondary | ICD-10-CM | POA: Diagnosis not present

## 2020-04-02 DIAGNOSIS — I10 Essential (primary) hypertension: Secondary | ICD-10-CM | POA: Diagnosis not present

## 2020-04-02 DIAGNOSIS — Z0000 Encounter for general adult medical examination without abnormal findings: Secondary | ICD-10-CM | POA: Diagnosis not present

## 2020-04-02 DIAGNOSIS — S81802A Unspecified open wound, left lower leg, initial encounter: Secondary | ICD-10-CM | POA: Diagnosis not present

## 2020-04-02 DIAGNOSIS — E785 Hyperlipidemia, unspecified: Secondary | ICD-10-CM | POA: Diagnosis not present

## 2020-04-12 DIAGNOSIS — I129 Hypertensive chronic kidney disease with stage 1 through stage 4 chronic kidney disease, or unspecified chronic kidney disease: Secondary | ICD-10-CM | POA: Diagnosis not present

## 2020-04-12 DIAGNOSIS — L97222 Non-pressure chronic ulcer of left calf with fat layer exposed: Secondary | ICD-10-CM | POA: Diagnosis not present

## 2020-04-12 DIAGNOSIS — E785 Hyperlipidemia, unspecified: Secondary | ICD-10-CM | POA: Diagnosis not present

## 2020-04-12 DIAGNOSIS — I83022 Varicose veins of left lower extremity with ulcer of calf: Secondary | ICD-10-CM | POA: Diagnosis not present

## 2020-04-12 DIAGNOSIS — N183 Chronic kidney disease, stage 3 unspecified: Secondary | ICD-10-CM | POA: Diagnosis not present

## 2020-04-12 DIAGNOSIS — L97822 Non-pressure chronic ulcer of other part of left lower leg with fat layer exposed: Secondary | ICD-10-CM | POA: Diagnosis not present

## 2020-04-12 DIAGNOSIS — I83028 Varicose veins of left lower extremity with ulcer other part of lower leg: Secondary | ICD-10-CM | POA: Diagnosis not present

## 2020-04-19 DIAGNOSIS — I878 Other specified disorders of veins: Secondary | ICD-10-CM | POA: Diagnosis not present

## 2020-04-19 DIAGNOSIS — L97222 Non-pressure chronic ulcer of left calf with fat layer exposed: Secondary | ICD-10-CM | POA: Diagnosis not present

## 2020-04-19 DIAGNOSIS — I83022 Varicose veins of left lower extremity with ulcer of calf: Secondary | ICD-10-CM | POA: Diagnosis not present

## 2020-04-19 DIAGNOSIS — L97822 Non-pressure chronic ulcer of other part of left lower leg with fat layer exposed: Secondary | ICD-10-CM | POA: Diagnosis not present

## 2020-04-19 DIAGNOSIS — I83028 Varicose veins of left lower extremity with ulcer other part of lower leg: Secondary | ICD-10-CM | POA: Diagnosis not present

## 2020-04-19 DIAGNOSIS — I890 Lymphedema, not elsewhere classified: Secondary | ICD-10-CM | POA: Diagnosis not present

## 2020-04-26 DIAGNOSIS — L97822 Non-pressure chronic ulcer of other part of left lower leg with fat layer exposed: Secondary | ICD-10-CM | POA: Diagnosis not present

## 2020-04-26 DIAGNOSIS — L97222 Non-pressure chronic ulcer of left calf with fat layer exposed: Secondary | ICD-10-CM | POA: Diagnosis not present

## 2020-04-26 DIAGNOSIS — I83022 Varicose veins of left lower extremity with ulcer of calf: Secondary | ICD-10-CM | POA: Diagnosis not present

## 2020-04-26 DIAGNOSIS — I890 Lymphedema, not elsewhere classified: Secondary | ICD-10-CM | POA: Diagnosis not present

## 2020-04-26 DIAGNOSIS — I83028 Varicose veins of left lower extremity with ulcer other part of lower leg: Secondary | ICD-10-CM | POA: Diagnosis not present

## 2020-05-02 ENCOUNTER — Encounter (HOSPITAL_BASED_OUTPATIENT_CLINIC_OR_DEPARTMENT_OTHER): Payer: Medicare Other | Admitting: Physician Assistant

## 2020-05-03 DIAGNOSIS — I890 Lymphedema, not elsewhere classified: Secondary | ICD-10-CM | POA: Diagnosis not present

## 2020-05-03 DIAGNOSIS — I83028 Varicose veins of left lower extremity with ulcer other part of lower leg: Secondary | ICD-10-CM | POA: Diagnosis not present

## 2020-05-03 DIAGNOSIS — L97822 Non-pressure chronic ulcer of other part of left lower leg with fat layer exposed: Secondary | ICD-10-CM | POA: Diagnosis not present

## 2020-05-07 DIAGNOSIS — I83022 Varicose veins of left lower extremity with ulcer of calf: Secondary | ICD-10-CM | POA: Diagnosis not present

## 2020-05-07 DIAGNOSIS — L97222 Non-pressure chronic ulcer of left calf with fat layer exposed: Secondary | ICD-10-CM | POA: Diagnosis not present

## 2020-05-07 DIAGNOSIS — I890 Lymphedema, not elsewhere classified: Secondary | ICD-10-CM | POA: Diagnosis not present

## 2020-05-07 DIAGNOSIS — I83028 Varicose veins of left lower extremity with ulcer other part of lower leg: Secondary | ICD-10-CM | POA: Diagnosis not present

## 2020-05-07 DIAGNOSIS — L97822 Non-pressure chronic ulcer of other part of left lower leg with fat layer exposed: Secondary | ICD-10-CM | POA: Diagnosis not present

## 2020-05-14 DIAGNOSIS — L97822 Non-pressure chronic ulcer of other part of left lower leg with fat layer exposed: Secondary | ICD-10-CM | POA: Diagnosis not present

## 2020-05-14 DIAGNOSIS — L97222 Non-pressure chronic ulcer of left calf with fat layer exposed: Secondary | ICD-10-CM | POA: Diagnosis not present

## 2020-05-14 DIAGNOSIS — I83028 Varicose veins of left lower extremity with ulcer other part of lower leg: Secondary | ICD-10-CM | POA: Diagnosis not present

## 2020-05-14 DIAGNOSIS — I83022 Varicose veins of left lower extremity with ulcer of calf: Secondary | ICD-10-CM | POA: Diagnosis not present

## 2020-05-16 DIAGNOSIS — L97822 Non-pressure chronic ulcer of other part of left lower leg with fat layer exposed: Secondary | ICD-10-CM | POA: Diagnosis not present

## 2020-05-16 DIAGNOSIS — I890 Lymphedema, not elsewhere classified: Secondary | ICD-10-CM | POA: Diagnosis not present

## 2020-05-16 DIAGNOSIS — I83028 Varicose veins of left lower extremity with ulcer other part of lower leg: Secondary | ICD-10-CM | POA: Diagnosis not present

## 2020-05-21 DIAGNOSIS — I890 Lymphedema, not elsewhere classified: Secondary | ICD-10-CM | POA: Diagnosis not present

## 2020-05-21 DIAGNOSIS — L97822 Non-pressure chronic ulcer of other part of left lower leg with fat layer exposed: Secondary | ICD-10-CM | POA: Diagnosis not present

## 2020-05-21 DIAGNOSIS — L97222 Non-pressure chronic ulcer of left calf with fat layer exposed: Secondary | ICD-10-CM | POA: Diagnosis not present

## 2020-05-21 DIAGNOSIS — I83022 Varicose veins of left lower extremity with ulcer of calf: Secondary | ICD-10-CM | POA: Diagnosis not present

## 2020-05-21 DIAGNOSIS — I83028 Varicose veins of left lower extremity with ulcer other part of lower leg: Secondary | ICD-10-CM | POA: Diagnosis not present

## 2020-05-28 DIAGNOSIS — L97822 Non-pressure chronic ulcer of other part of left lower leg with fat layer exposed: Secondary | ICD-10-CM | POA: Diagnosis not present

## 2020-05-28 DIAGNOSIS — I83022 Varicose veins of left lower extremity with ulcer of calf: Secondary | ICD-10-CM | POA: Diagnosis not present

## 2020-05-28 DIAGNOSIS — L97222 Non-pressure chronic ulcer of left calf with fat layer exposed: Secondary | ICD-10-CM | POA: Diagnosis not present

## 2020-05-28 DIAGNOSIS — I83028 Varicose veins of left lower extremity with ulcer other part of lower leg: Secondary | ICD-10-CM | POA: Diagnosis not present

## 2020-05-28 DIAGNOSIS — I890 Lymphedema, not elsewhere classified: Secondary | ICD-10-CM | POA: Diagnosis not present

## 2020-06-04 DIAGNOSIS — L97222 Non-pressure chronic ulcer of left calf with fat layer exposed: Secondary | ICD-10-CM | POA: Diagnosis not present

## 2020-06-04 DIAGNOSIS — I890 Lymphedema, not elsewhere classified: Secondary | ICD-10-CM | POA: Diagnosis not present

## 2020-06-04 DIAGNOSIS — I83022 Varicose veins of left lower extremity with ulcer of calf: Secondary | ICD-10-CM | POA: Diagnosis not present

## 2020-06-04 DIAGNOSIS — I83028 Varicose veins of left lower extremity with ulcer other part of lower leg: Secondary | ICD-10-CM | POA: Diagnosis not present

## 2020-06-04 DIAGNOSIS — L97822 Non-pressure chronic ulcer of other part of left lower leg with fat layer exposed: Secondary | ICD-10-CM | POA: Diagnosis not present

## 2020-06-11 DIAGNOSIS — I83022 Varicose veins of left lower extremity with ulcer of calf: Secondary | ICD-10-CM | POA: Diagnosis not present

## 2020-06-11 DIAGNOSIS — L97222 Non-pressure chronic ulcer of left calf with fat layer exposed: Secondary | ICD-10-CM | POA: Diagnosis not present

## 2020-06-11 DIAGNOSIS — I878 Other specified disorders of veins: Secondary | ICD-10-CM | POA: Diagnosis not present

## 2020-06-18 DIAGNOSIS — L97822 Non-pressure chronic ulcer of other part of left lower leg with fat layer exposed: Secondary | ICD-10-CM | POA: Diagnosis not present

## 2020-06-18 DIAGNOSIS — L97222 Non-pressure chronic ulcer of left calf with fat layer exposed: Secondary | ICD-10-CM | POA: Diagnosis not present

## 2020-06-18 DIAGNOSIS — I83022 Varicose veins of left lower extremity with ulcer of calf: Secondary | ICD-10-CM | POA: Diagnosis not present

## 2020-06-18 DIAGNOSIS — I83028 Varicose veins of left lower extremity with ulcer other part of lower leg: Secondary | ICD-10-CM | POA: Diagnosis not present

## 2020-06-28 DIAGNOSIS — I83028 Varicose veins of left lower extremity with ulcer other part of lower leg: Secondary | ICD-10-CM | POA: Diagnosis not present

## 2020-06-28 DIAGNOSIS — L97822 Non-pressure chronic ulcer of other part of left lower leg with fat layer exposed: Secondary | ICD-10-CM | POA: Diagnosis not present

## 2020-06-28 DIAGNOSIS — L97222 Non-pressure chronic ulcer of left calf with fat layer exposed: Secondary | ICD-10-CM | POA: Diagnosis not present

## 2020-06-28 DIAGNOSIS — I872 Venous insufficiency (chronic) (peripheral): Secondary | ICD-10-CM | POA: Diagnosis not present

## 2020-06-28 DIAGNOSIS — I83022 Varicose veins of left lower extremity with ulcer of calf: Secondary | ICD-10-CM | POA: Diagnosis not present

## 2020-06-28 DIAGNOSIS — I83222 Varicose veins of left lower extremity with both ulcer of calf and inflammation: Secondary | ICD-10-CM | POA: Diagnosis not present

## 2020-07-05 DIAGNOSIS — I83028 Varicose veins of left lower extremity with ulcer other part of lower leg: Secondary | ICD-10-CM | POA: Diagnosis not present

## 2020-07-05 DIAGNOSIS — I83022 Varicose veins of left lower extremity with ulcer of calf: Secondary | ICD-10-CM | POA: Diagnosis not present

## 2020-07-05 DIAGNOSIS — L97822 Non-pressure chronic ulcer of other part of left lower leg with fat layer exposed: Secondary | ICD-10-CM | POA: Diagnosis not present

## 2020-07-05 DIAGNOSIS — I872 Venous insufficiency (chronic) (peripheral): Secondary | ICD-10-CM | POA: Diagnosis not present

## 2020-07-05 DIAGNOSIS — L97222 Non-pressure chronic ulcer of left calf with fat layer exposed: Secondary | ICD-10-CM | POA: Diagnosis not present

## 2020-07-19 DIAGNOSIS — L97222 Non-pressure chronic ulcer of left calf with fat layer exposed: Secondary | ICD-10-CM | POA: Diagnosis not present

## 2020-07-19 DIAGNOSIS — Z79899 Other long term (current) drug therapy: Secondary | ICD-10-CM | POA: Diagnosis not present

## 2020-07-19 DIAGNOSIS — I83022 Varicose veins of left lower extremity with ulcer of calf: Secondary | ICD-10-CM | POA: Diagnosis not present

## 2020-07-19 DIAGNOSIS — L97822 Non-pressure chronic ulcer of other part of left lower leg with fat layer exposed: Secondary | ICD-10-CM | POA: Diagnosis not present

## 2020-07-19 DIAGNOSIS — I83028 Varicose veins of left lower extremity with ulcer other part of lower leg: Secondary | ICD-10-CM | POA: Diagnosis not present

## 2020-07-26 DIAGNOSIS — L97822 Non-pressure chronic ulcer of other part of left lower leg with fat layer exposed: Secondary | ICD-10-CM | POA: Diagnosis not present

## 2020-07-26 DIAGNOSIS — L97222 Non-pressure chronic ulcer of left calf with fat layer exposed: Secondary | ICD-10-CM | POA: Diagnosis not present

## 2020-07-26 DIAGNOSIS — I83028 Varicose veins of left lower extremity with ulcer other part of lower leg: Secondary | ICD-10-CM | POA: Diagnosis not present

## 2020-07-26 DIAGNOSIS — I83022 Varicose veins of left lower extremity with ulcer of calf: Secondary | ICD-10-CM | POA: Diagnosis not present

## 2020-07-26 DIAGNOSIS — I872 Venous insufficiency (chronic) (peripheral): Secondary | ICD-10-CM | POA: Diagnosis not present

## 2020-08-02 DIAGNOSIS — L97222 Non-pressure chronic ulcer of left calf with fat layer exposed: Secondary | ICD-10-CM | POA: Diagnosis not present

## 2020-08-02 DIAGNOSIS — I872 Venous insufficiency (chronic) (peripheral): Secondary | ICD-10-CM | POA: Diagnosis not present

## 2020-08-02 DIAGNOSIS — L97822 Non-pressure chronic ulcer of other part of left lower leg with fat layer exposed: Secondary | ICD-10-CM | POA: Diagnosis not present

## 2020-08-02 DIAGNOSIS — I83022 Varicose veins of left lower extremity with ulcer of calf: Secondary | ICD-10-CM | POA: Diagnosis not present

## 2020-08-02 DIAGNOSIS — I83028 Varicose veins of left lower extremity with ulcer other part of lower leg: Secondary | ICD-10-CM | POA: Diagnosis not present

## 2020-08-09 DIAGNOSIS — L97222 Non-pressure chronic ulcer of left calf with fat layer exposed: Secondary | ICD-10-CM | POA: Diagnosis not present

## 2020-08-09 DIAGNOSIS — I872 Venous insufficiency (chronic) (peripheral): Secondary | ICD-10-CM | POA: Diagnosis not present

## 2020-08-09 DIAGNOSIS — I83022 Varicose veins of left lower extremity with ulcer of calf: Secondary | ICD-10-CM | POA: Diagnosis not present

## 2020-08-09 DIAGNOSIS — L97822 Non-pressure chronic ulcer of other part of left lower leg with fat layer exposed: Secondary | ICD-10-CM | POA: Diagnosis not present

## 2020-08-09 DIAGNOSIS — I83028 Varicose veins of left lower extremity with ulcer other part of lower leg: Secondary | ICD-10-CM | POA: Diagnosis not present

## 2020-08-16 DIAGNOSIS — L97222 Non-pressure chronic ulcer of left calf with fat layer exposed: Secondary | ICD-10-CM | POA: Diagnosis not present

## 2020-08-16 DIAGNOSIS — I83022 Varicose veins of left lower extremity with ulcer of calf: Secondary | ICD-10-CM | POA: Diagnosis not present

## 2020-08-16 DIAGNOSIS — I83028 Varicose veins of left lower extremity with ulcer other part of lower leg: Secondary | ICD-10-CM | POA: Diagnosis not present

## 2020-08-16 DIAGNOSIS — L97822 Non-pressure chronic ulcer of other part of left lower leg with fat layer exposed: Secondary | ICD-10-CM | POA: Diagnosis not present

## 2020-10-08 DIAGNOSIS — Z23 Encounter for immunization: Secondary | ICD-10-CM | POA: Diagnosis not present

## 2020-11-29 ENCOUNTER — Other Ambulatory Visit: Payer: Self-pay | Admitting: Internal Medicine

## 2020-11-29 DIAGNOSIS — Z1231 Encounter for screening mammogram for malignant neoplasm of breast: Secondary | ICD-10-CM

## 2020-12-03 ENCOUNTER — Ambulatory Visit
Admission: RE | Admit: 2020-12-03 | Discharge: 2020-12-03 | Disposition: A | Discharge status: Home / Self Care | Payer: Medicare Other | Source: Ambulatory Visit | Attending: Internal Medicine | Admitting: Internal Medicine

## 2020-12-03 ENCOUNTER — Other Ambulatory Visit: Payer: Self-pay

## 2020-12-03 DIAGNOSIS — Z1231 Encounter for screening mammogram for malignant neoplasm of breast: Secondary | ICD-10-CM

## 2021-01-24 DIAGNOSIS — E785 Hyperlipidemia, unspecified: Secondary | ICD-10-CM | POA: Diagnosis not present

## 2021-01-24 DIAGNOSIS — M810 Age-related osteoporosis without current pathological fracture: Secondary | ICD-10-CM | POA: Diagnosis not present

## 2021-01-24 DIAGNOSIS — I10 Essential (primary) hypertension: Secondary | ICD-10-CM | POA: Diagnosis not present

## 2021-01-24 DIAGNOSIS — M419 Scoliosis, unspecified: Secondary | ICD-10-CM | POA: Diagnosis not present

## 2021-01-29 DIAGNOSIS — N1832 Chronic kidney disease, stage 3b: Secondary | ICD-10-CM | POA: Diagnosis not present

## 2021-01-29 DIAGNOSIS — I83893 Varicose veins of bilateral lower extremities with other complications: Secondary | ICD-10-CM | POA: Diagnosis not present

## 2021-01-29 DIAGNOSIS — D692 Other nonthrombocytopenic purpura: Secondary | ICD-10-CM | POA: Diagnosis not present

## 2021-01-29 DIAGNOSIS — S81811A Laceration without foreign body, right lower leg, initial encounter: Secondary | ICD-10-CM | POA: Diagnosis not present

## 2021-01-29 DIAGNOSIS — R600 Localized edema: Secondary | ICD-10-CM | POA: Diagnosis not present

## 2021-01-29 DIAGNOSIS — I129 Hypertensive chronic kidney disease with stage 1 through stage 4 chronic kidney disease, or unspecified chronic kidney disease: Secondary | ICD-10-CM | POA: Diagnosis not present

## 2021-02-05 DIAGNOSIS — R600 Localized edema: Secondary | ICD-10-CM | POA: Diagnosis not present

## 2021-02-05 DIAGNOSIS — I83893 Varicose veins of bilateral lower extremities with other complications: Secondary | ICD-10-CM | POA: Diagnosis not present

## 2021-02-05 DIAGNOSIS — D692 Other nonthrombocytopenic purpura: Secondary | ICD-10-CM | POA: Diagnosis not present

## 2021-02-05 DIAGNOSIS — S81811D Laceration without foreign body, right lower leg, subsequent encounter: Secondary | ICD-10-CM | POA: Diagnosis not present

## 2021-02-11 DIAGNOSIS — D692 Other nonthrombocytopenic purpura: Secondary | ICD-10-CM | POA: Diagnosis not present

## 2021-02-11 DIAGNOSIS — S8011XA Contusion of right lower leg, initial encounter: Secondary | ICD-10-CM | POA: Diagnosis not present

## 2021-02-11 DIAGNOSIS — I83893 Varicose veins of bilateral lower extremities with other complications: Secondary | ICD-10-CM | POA: Diagnosis not present

## 2021-02-11 DIAGNOSIS — S81811D Laceration without foreign body, right lower leg, subsequent encounter: Secondary | ICD-10-CM | POA: Diagnosis not present

## 2021-02-11 DIAGNOSIS — R600 Localized edema: Secondary | ICD-10-CM | POA: Diagnosis not present

## 2021-02-18 ENCOUNTER — Ambulatory Visit (HOSPITAL_COMMUNITY)
Admission: RE | Admit: 2021-02-18 | Discharge: 2021-02-18 | Disposition: A | Discharge status: Home / Self Care | Payer: Medicare Other | Source: Ambulatory Visit | Attending: Internal Medicine | Admitting: Internal Medicine

## 2021-02-18 ENCOUNTER — Other Ambulatory Visit: Payer: Self-pay

## 2021-02-18 ENCOUNTER — Other Ambulatory Visit (HOSPITAL_COMMUNITY): Payer: Self-pay | Admitting: Family Medicine

## 2021-02-18 DIAGNOSIS — M79605 Pain in left leg: Secondary | ICD-10-CM

## 2021-02-18 DIAGNOSIS — R600 Localized edema: Secondary | ICD-10-CM | POA: Diagnosis not present

## 2021-02-18 DIAGNOSIS — S8011XA Contusion of right lower leg, initial encounter: Secondary | ICD-10-CM | POA: Diagnosis not present

## 2021-02-18 DIAGNOSIS — D692 Other nonthrombocytopenic purpura: Secondary | ICD-10-CM | POA: Diagnosis not present

## 2021-02-18 DIAGNOSIS — T148XXA Other injury of unspecified body region, initial encounter: Secondary | ICD-10-CM | POA: Diagnosis not present

## 2021-02-18 DIAGNOSIS — M7989 Other specified soft tissue disorders: Secondary | ICD-10-CM | POA: Diagnosis not present

## 2021-02-18 DIAGNOSIS — I83893 Varicose veins of bilateral lower extremities with other complications: Secondary | ICD-10-CM | POA: Diagnosis not present

## 2021-02-18 DIAGNOSIS — L819 Disorder of pigmentation, unspecified: Secondary | ICD-10-CM | POA: Diagnosis not present

## 2021-02-18 DIAGNOSIS — S81811D Laceration without foreign body, right lower leg, subsequent encounter: Secondary | ICD-10-CM | POA: Diagnosis not present

## 2021-02-21 DIAGNOSIS — S8011XA Contusion of right lower leg, initial encounter: Secondary | ICD-10-CM | POA: Diagnosis not present

## 2021-02-21 DIAGNOSIS — L97919 Non-pressure chronic ulcer of unspecified part of right lower leg with unspecified severity: Secondary | ICD-10-CM | POA: Diagnosis not present

## 2021-02-21 DIAGNOSIS — D692 Other nonthrombocytopenic purpura: Secondary | ICD-10-CM | POA: Diagnosis not present

## 2021-02-21 DIAGNOSIS — R600 Localized edema: Secondary | ICD-10-CM | POA: Diagnosis not present

## 2021-02-21 DIAGNOSIS — S81811D Laceration without foreign body, right lower leg, subsequent encounter: Secondary | ICD-10-CM | POA: Diagnosis not present

## 2021-02-21 DIAGNOSIS — T148XXA Other injury of unspecified body region, initial encounter: Secondary | ICD-10-CM | POA: Diagnosis not present

## 2021-02-21 DIAGNOSIS — L819 Disorder of pigmentation, unspecified: Secondary | ICD-10-CM | POA: Diagnosis not present

## 2021-02-21 DIAGNOSIS — I83893 Varicose veins of bilateral lower extremities with other complications: Secondary | ICD-10-CM | POA: Diagnosis not present

## 2021-02-26 DIAGNOSIS — I83893 Varicose veins of bilateral lower extremities with other complications: Secondary | ICD-10-CM | POA: Diagnosis not present

## 2021-02-26 DIAGNOSIS — L97919 Non-pressure chronic ulcer of unspecified part of right lower leg with unspecified severity: Secondary | ICD-10-CM | POA: Diagnosis not present

## 2021-02-26 DIAGNOSIS — T148XXA Other injury of unspecified body region, initial encounter: Secondary | ICD-10-CM | POA: Diagnosis not present

## 2021-02-26 DIAGNOSIS — Z23 Encounter for immunization: Secondary | ICD-10-CM | POA: Diagnosis not present

## 2021-02-26 DIAGNOSIS — S8011XA Contusion of right lower leg, initial encounter: Secondary | ICD-10-CM | POA: Diagnosis not present

## 2021-02-26 DIAGNOSIS — R600 Localized edema: Secondary | ICD-10-CM | POA: Diagnosis not present

## 2021-02-26 DIAGNOSIS — S81811D Laceration without foreign body, right lower leg, subsequent encounter: Secondary | ICD-10-CM | POA: Diagnosis not present

## 2021-02-26 DIAGNOSIS — D692 Other nonthrombocytopenic purpura: Secondary | ICD-10-CM | POA: Diagnosis not present

## 2021-03-04 DIAGNOSIS — L97212 Non-pressure chronic ulcer of right calf with fat layer exposed: Secondary | ICD-10-CM | POA: Diagnosis not present

## 2021-03-04 DIAGNOSIS — L97812 Non-pressure chronic ulcer of other part of right lower leg with fat layer exposed: Secondary | ICD-10-CM | POA: Diagnosis not present

## 2021-03-04 DIAGNOSIS — I129 Hypertensive chronic kidney disease with stage 1 through stage 4 chronic kidney disease, or unspecified chronic kidney disease: Secondary | ICD-10-CM | POA: Diagnosis not present

## 2021-03-04 DIAGNOSIS — N183 Chronic kidney disease, stage 3 unspecified: Secondary | ICD-10-CM | POA: Diagnosis not present

## 2021-03-04 DIAGNOSIS — I83012 Varicose veins of right lower extremity with ulcer of calf: Secondary | ICD-10-CM | POA: Diagnosis not present

## 2021-03-04 DIAGNOSIS — I83018 Varicose veins of right lower extremity with ulcer other part of lower leg: Secondary | ICD-10-CM | POA: Diagnosis not present

## 2021-03-04 DIAGNOSIS — I872 Venous insufficiency (chronic) (peripheral): Secondary | ICD-10-CM | POA: Diagnosis not present

## 2021-03-11 DIAGNOSIS — I872 Venous insufficiency (chronic) (peripheral): Secondary | ICD-10-CM | POA: Diagnosis not present

## 2021-03-11 DIAGNOSIS — I83018 Varicose veins of right lower extremity with ulcer other part of lower leg: Secondary | ICD-10-CM | POA: Diagnosis not present

## 2021-03-11 DIAGNOSIS — L97812 Non-pressure chronic ulcer of other part of right lower leg with fat layer exposed: Secondary | ICD-10-CM | POA: Diagnosis not present

## 2021-03-11 DIAGNOSIS — L97212 Non-pressure chronic ulcer of right calf with fat layer exposed: Secondary | ICD-10-CM | POA: Diagnosis not present

## 2021-03-11 DIAGNOSIS — I83012 Varicose veins of right lower extremity with ulcer of calf: Secondary | ICD-10-CM | POA: Diagnosis not present

## 2021-03-18 DIAGNOSIS — L97812 Non-pressure chronic ulcer of other part of right lower leg with fat layer exposed: Secondary | ICD-10-CM | POA: Diagnosis not present

## 2021-03-18 DIAGNOSIS — L97212 Non-pressure chronic ulcer of right calf with fat layer exposed: Secondary | ICD-10-CM | POA: Diagnosis not present

## 2021-03-18 DIAGNOSIS — I83012 Varicose veins of right lower extremity with ulcer of calf: Secondary | ICD-10-CM | POA: Diagnosis not present

## 2021-03-18 DIAGNOSIS — I83018 Varicose veins of right lower extremity with ulcer other part of lower leg: Secondary | ICD-10-CM | POA: Diagnosis not present

## 2021-03-25 DIAGNOSIS — H2512 Age-related nuclear cataract, left eye: Secondary | ICD-10-CM | POA: Diagnosis not present

## 2021-03-25 DIAGNOSIS — H04123 Dry eye syndrome of bilateral lacrimal glands: Secondary | ICD-10-CM | POA: Diagnosis not present

## 2021-03-25 DIAGNOSIS — H43813 Vitreous degeneration, bilateral: Secondary | ICD-10-CM | POA: Diagnosis not present

## 2021-03-25 DIAGNOSIS — H524 Presbyopia: Secondary | ICD-10-CM | POA: Diagnosis not present

## 2021-03-26 DIAGNOSIS — I83018 Varicose veins of right lower extremity with ulcer other part of lower leg: Secondary | ICD-10-CM | POA: Diagnosis not present

## 2021-03-26 DIAGNOSIS — L97812 Non-pressure chronic ulcer of other part of right lower leg with fat layer exposed: Secondary | ICD-10-CM | POA: Diagnosis not present

## 2021-03-26 DIAGNOSIS — I872 Venous insufficiency (chronic) (peripheral): Secondary | ICD-10-CM | POA: Diagnosis not present

## 2021-03-26 DIAGNOSIS — L97212 Non-pressure chronic ulcer of right calf with fat layer exposed: Secondary | ICD-10-CM | POA: Diagnosis not present

## 2021-03-26 DIAGNOSIS — I83012 Varicose veins of right lower extremity with ulcer of calf: Secondary | ICD-10-CM | POA: Diagnosis not present

## 2021-04-02 DIAGNOSIS — I872 Venous insufficiency (chronic) (peripheral): Secondary | ICD-10-CM | POA: Diagnosis not present

## 2021-04-02 DIAGNOSIS — I83018 Varicose veins of right lower extremity with ulcer other part of lower leg: Secondary | ICD-10-CM | POA: Diagnosis not present

## 2021-04-02 DIAGNOSIS — L97812 Non-pressure chronic ulcer of other part of right lower leg with fat layer exposed: Secondary | ICD-10-CM | POA: Diagnosis not present

## 2021-04-09 DIAGNOSIS — I83022 Varicose veins of left lower extremity with ulcer of calf: Secondary | ICD-10-CM | POA: Diagnosis not present

## 2021-04-09 DIAGNOSIS — I83012 Varicose veins of right lower extremity with ulcer of calf: Secondary | ICD-10-CM | POA: Diagnosis not present

## 2021-04-09 DIAGNOSIS — L97212 Non-pressure chronic ulcer of right calf with fat layer exposed: Secondary | ICD-10-CM | POA: Diagnosis not present

## 2021-04-09 DIAGNOSIS — I83018 Varicose veins of right lower extremity with ulcer other part of lower leg: Secondary | ICD-10-CM | POA: Diagnosis not present

## 2021-04-09 DIAGNOSIS — L97812 Non-pressure chronic ulcer of other part of right lower leg with fat layer exposed: Secondary | ICD-10-CM | POA: Diagnosis not present

## 2021-04-09 DIAGNOSIS — L97222 Non-pressure chronic ulcer of left calf with fat layer exposed: Secondary | ICD-10-CM | POA: Diagnosis not present

## 2021-04-10 DIAGNOSIS — M810 Age-related osteoporosis without current pathological fracture: Secondary | ICD-10-CM | POA: Diagnosis not present

## 2021-04-10 DIAGNOSIS — I10 Essential (primary) hypertension: Secondary | ICD-10-CM | POA: Diagnosis not present

## 2021-04-10 DIAGNOSIS — R946 Abnormal results of thyroid function studies: Secondary | ICD-10-CM | POA: Diagnosis not present

## 2021-04-10 DIAGNOSIS — E785 Hyperlipidemia, unspecified: Secondary | ICD-10-CM | POA: Diagnosis not present

## 2021-04-15 DIAGNOSIS — Z23 Encounter for immunization: Secondary | ICD-10-CM | POA: Diagnosis not present

## 2021-04-16 DIAGNOSIS — L97812 Non-pressure chronic ulcer of other part of right lower leg with fat layer exposed: Secondary | ICD-10-CM | POA: Diagnosis not present

## 2021-04-16 DIAGNOSIS — I83012 Varicose veins of right lower extremity with ulcer of calf: Secondary | ICD-10-CM | POA: Diagnosis not present

## 2021-04-16 DIAGNOSIS — I872 Venous insufficiency (chronic) (peripheral): Secondary | ICD-10-CM | POA: Diagnosis not present

## 2021-04-16 DIAGNOSIS — L97212 Non-pressure chronic ulcer of right calf with fat layer exposed: Secondary | ICD-10-CM | POA: Diagnosis not present

## 2021-04-16 DIAGNOSIS — I83018 Varicose veins of right lower extremity with ulcer other part of lower leg: Secondary | ICD-10-CM | POA: Diagnosis not present

## 2021-04-17 DIAGNOSIS — E785 Hyperlipidemia, unspecified: Secondary | ICD-10-CM | POA: Diagnosis not present

## 2021-04-17 DIAGNOSIS — Z1339 Encounter for screening examination for other mental health and behavioral disorders: Secondary | ICD-10-CM | POA: Diagnosis not present

## 2021-04-17 DIAGNOSIS — N1831 Chronic kidney disease, stage 3a: Secondary | ICD-10-CM | POA: Diagnosis not present

## 2021-04-17 DIAGNOSIS — D692 Other nonthrombocytopenic purpura: Secondary | ICD-10-CM | POA: Diagnosis not present

## 2021-04-17 DIAGNOSIS — S8011XA Contusion of right lower leg, initial encounter: Secondary | ICD-10-CM | POA: Diagnosis not present

## 2021-04-17 DIAGNOSIS — I83893 Varicose veins of bilateral lower extremities with other complications: Secondary | ICD-10-CM | POA: Diagnosis not present

## 2021-04-17 DIAGNOSIS — R82998 Other abnormal findings in urine: Secondary | ICD-10-CM | POA: Diagnosis not present

## 2021-04-17 DIAGNOSIS — I10 Essential (primary) hypertension: Secondary | ICD-10-CM | POA: Diagnosis not present

## 2021-04-17 DIAGNOSIS — Z1331 Encounter for screening for depression: Secondary | ICD-10-CM | POA: Diagnosis not present

## 2021-04-17 DIAGNOSIS — Z0000 Encounter for general adult medical examination without abnormal findings: Secondary | ICD-10-CM | POA: Diagnosis not present

## 2021-04-17 DIAGNOSIS — L97919 Non-pressure chronic ulcer of unspecified part of right lower leg with unspecified severity: Secondary | ICD-10-CM | POA: Diagnosis not present

## 2021-07-06 IMAGING — MG MM DIGITAL SCREENING BILAT W/ TOMO AND CAD
8 series · 9 of 24 positions shown · non-contrast
Comparison: Previous exam(s).

CLINICAL DATA: Screening.

EXAM:
DIGITAL SCREENING BILATERAL MAMMOGRAM WITH TOMOSYNTHESIS AND CAD
TECHNIQUE: Bilateral screening digital craniocaudal and mediolateral oblique
mammograms were obtained. Bilateral screening digital breast
tomosynthesis was performed. The images were evaluated with
computer-aided detection.

[R CC synth-2D]
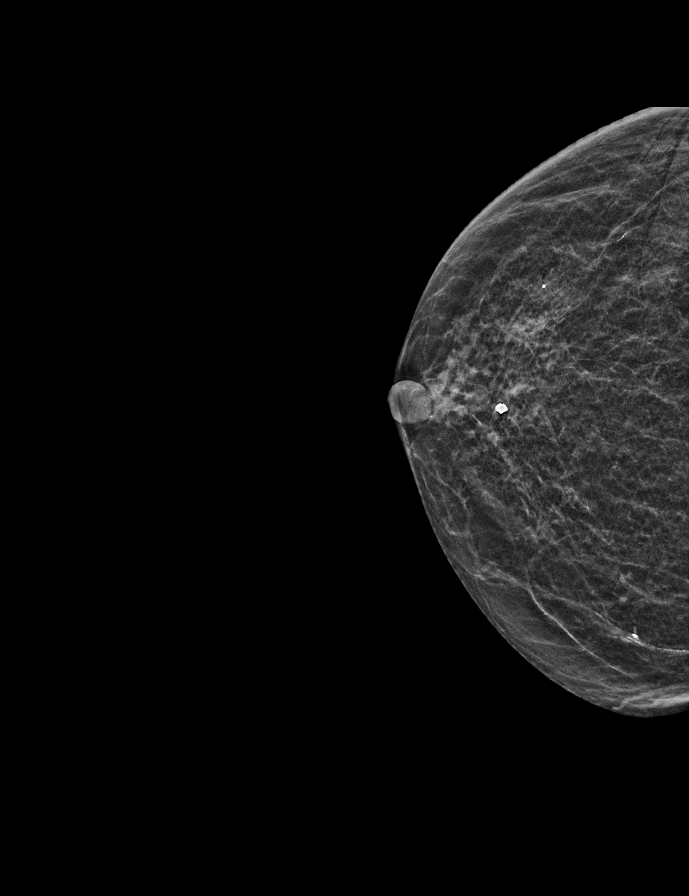

[L CC synth-2D]
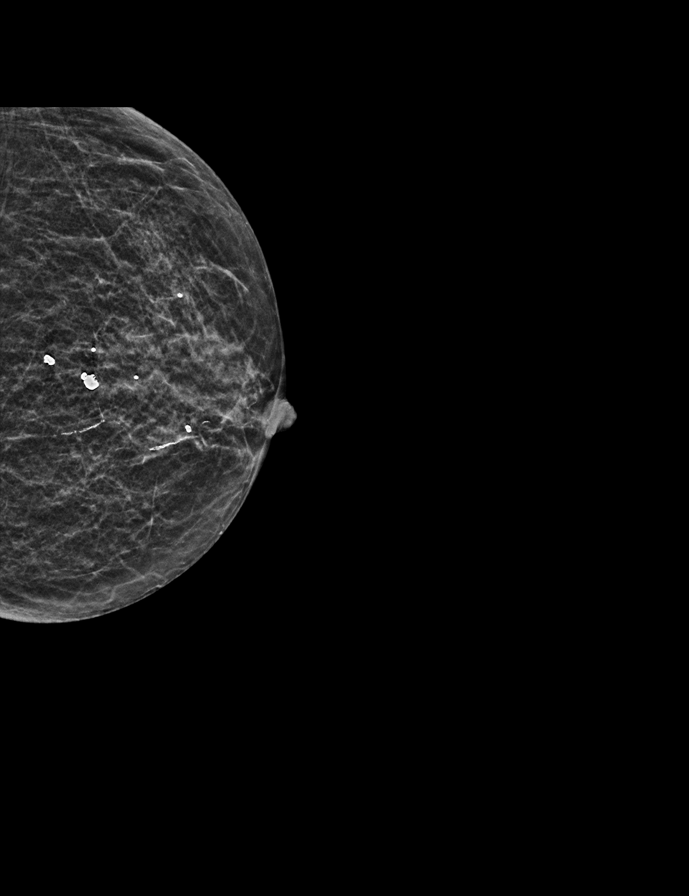

[R MLO synth-2D]
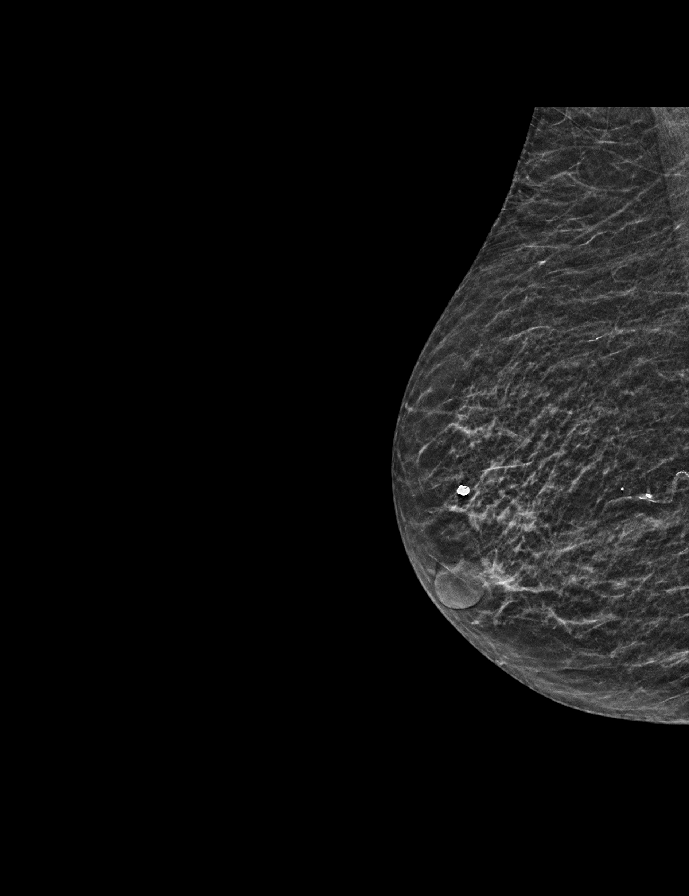

[L MLO synth-2D]
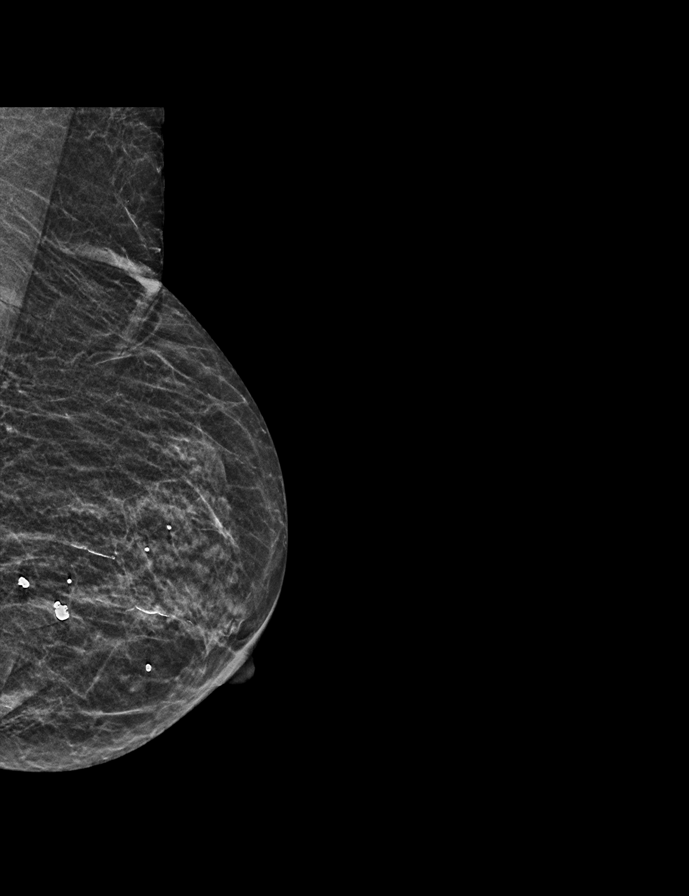

[L MLO tomo · 2 of 26 frames shown]
[frame 9/26]
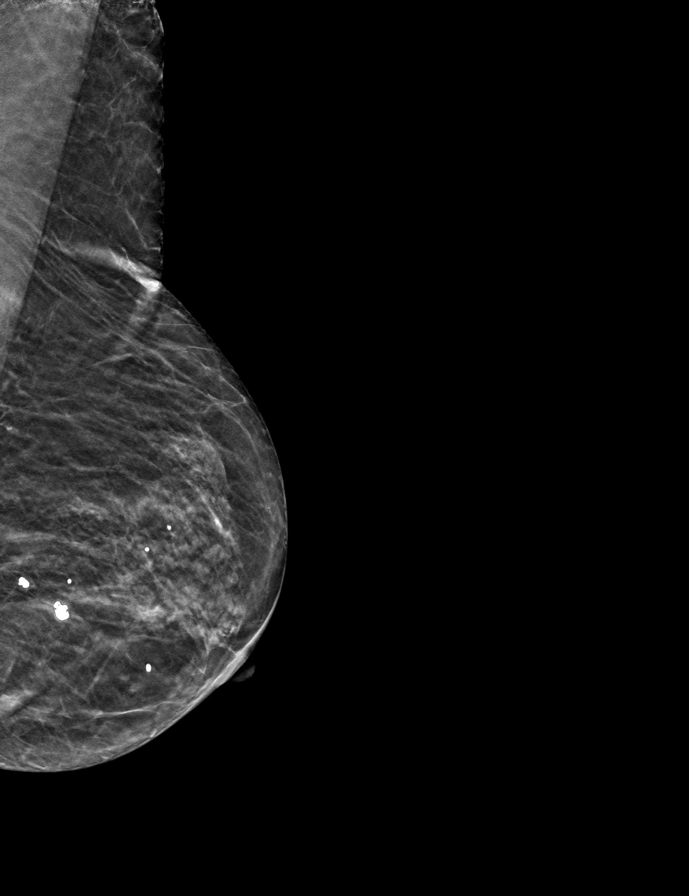
[frame 13/26]
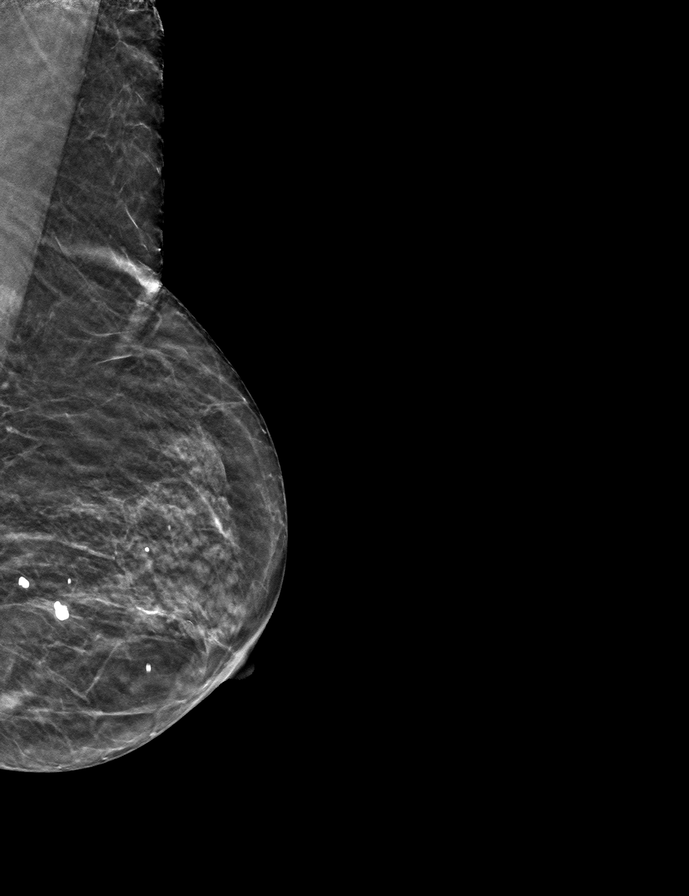

[L CC tomo · tomo slice 12/23.0]
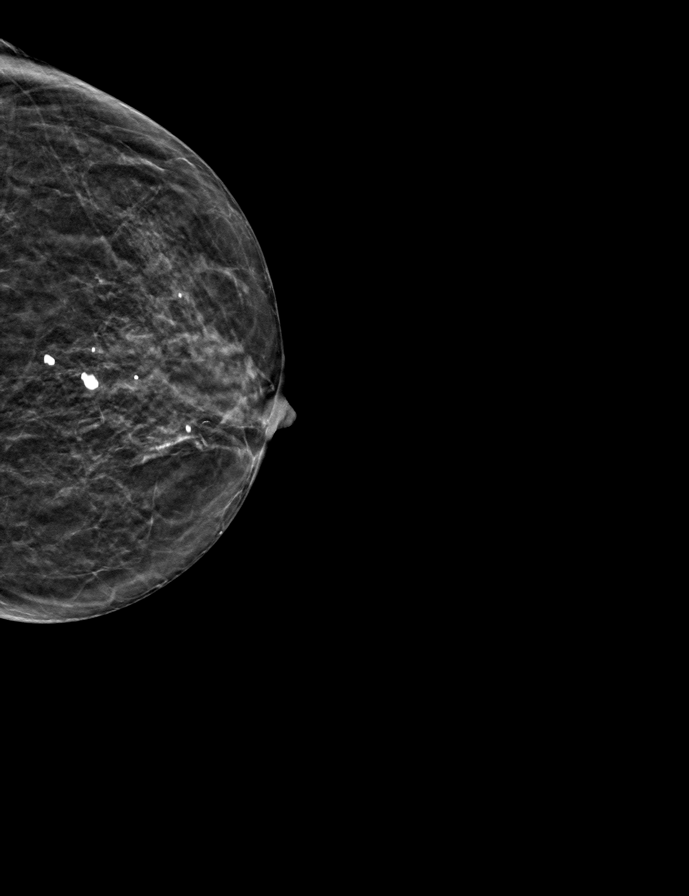

[R CC tomo · tomo slice 13/26.0]
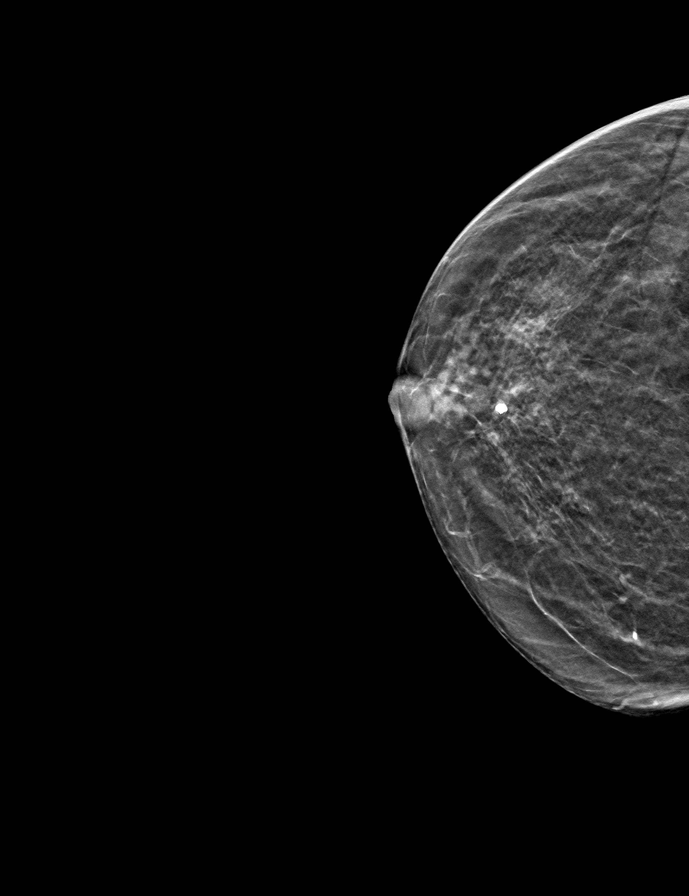

[R MLO tomo · tomo slice 15/28.0]
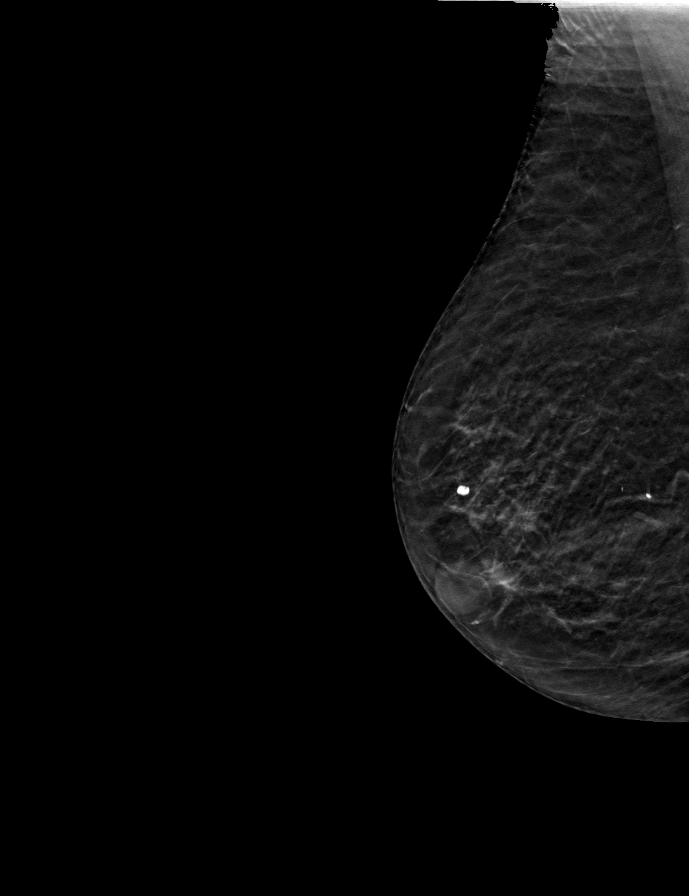

[9 of 24 positions shown; findings below may reference images not displayed]

ACR Breast Density Category b: There are scattered areas of
fibroglandular density.
FINDINGS: There are no findings suspicious for malignancy. The images were
evaluated with computer-aided detection.
IMPRESSION: No mammographic evidence of malignancy. A result letter of this
screening mammogram will be mailed directly to the patient.

RECOMMENDATION:
Screening mammogram in one year. (Code:WJ-I-BG6)

BI-RADS CATEGORY  1: Negative.

## 2021-10-07 DIAGNOSIS — I872 Venous insufficiency (chronic) (peripheral): Secondary | ICD-10-CM | POA: Diagnosis not present

## 2021-10-07 DIAGNOSIS — L97212 Non-pressure chronic ulcer of right calf with fat layer exposed: Secondary | ICD-10-CM | POA: Diagnosis not present

## 2021-10-07 DIAGNOSIS — L97812 Non-pressure chronic ulcer of other part of right lower leg with fat layer exposed: Secondary | ICD-10-CM | POA: Diagnosis not present

## 2021-10-07 DIAGNOSIS — I83012 Varicose veins of right lower extremity with ulcer of calf: Secondary | ICD-10-CM | POA: Diagnosis not present

## 2021-10-14 DIAGNOSIS — L97212 Non-pressure chronic ulcer of right calf with fat layer exposed: Secondary | ICD-10-CM | POA: Diagnosis not present

## 2021-10-14 DIAGNOSIS — L97812 Non-pressure chronic ulcer of other part of right lower leg with fat layer exposed: Secondary | ICD-10-CM | POA: Diagnosis not present

## 2021-10-14 DIAGNOSIS — I83018 Varicose veins of right lower extremity with ulcer other part of lower leg: Secondary | ICD-10-CM | POA: Diagnosis not present

## 2021-10-14 DIAGNOSIS — I872 Venous insufficiency (chronic) (peripheral): Secondary | ICD-10-CM | POA: Diagnosis not present

## 2021-10-21 DIAGNOSIS — L97812 Non-pressure chronic ulcer of other part of right lower leg with fat layer exposed: Secondary | ICD-10-CM | POA: Diagnosis not present

## 2021-10-21 DIAGNOSIS — I83018 Varicose veins of right lower extremity with ulcer other part of lower leg: Secondary | ICD-10-CM | POA: Diagnosis not present

## 2021-10-21 DIAGNOSIS — L97212 Non-pressure chronic ulcer of right calf with fat layer exposed: Secondary | ICD-10-CM | POA: Diagnosis not present

## 2021-10-21 DIAGNOSIS — I872 Venous insufficiency (chronic) (peripheral): Secondary | ICD-10-CM | POA: Diagnosis not present

## 2021-10-21 DIAGNOSIS — I83012 Varicose veins of right lower extremity with ulcer of calf: Secondary | ICD-10-CM | POA: Diagnosis not present

## 2021-10-22 DIAGNOSIS — I129 Hypertensive chronic kidney disease with stage 1 through stage 4 chronic kidney disease, or unspecified chronic kidney disease: Secondary | ICD-10-CM | POA: Diagnosis not present

## 2021-10-22 DIAGNOSIS — E785 Hyperlipidemia, unspecified: Secondary | ICD-10-CM | POA: Diagnosis not present

## 2021-10-22 DIAGNOSIS — D692 Other nonthrombocytopenic purpura: Secondary | ICD-10-CM | POA: Diagnosis not present

## 2021-10-22 DIAGNOSIS — L97919 Non-pressure chronic ulcer of unspecified part of right lower leg with unspecified severity: Secondary | ICD-10-CM | POA: Diagnosis not present

## 2021-10-22 DIAGNOSIS — I83893 Varicose veins of bilateral lower extremities with other complications: Secondary | ICD-10-CM | POA: Diagnosis not present

## 2021-10-22 DIAGNOSIS — S8011XA Contusion of right lower leg, initial encounter: Secondary | ICD-10-CM | POA: Diagnosis not present

## 2021-10-22 DIAGNOSIS — N1831 Chronic kidney disease, stage 3a: Secondary | ICD-10-CM | POA: Diagnosis not present

## 2021-10-28 DIAGNOSIS — I83012 Varicose veins of right lower extremity with ulcer of calf: Secondary | ICD-10-CM | POA: Diagnosis not present

## 2021-10-28 DIAGNOSIS — E785 Hyperlipidemia, unspecified: Secondary | ICD-10-CM | POA: Diagnosis not present

## 2021-10-28 DIAGNOSIS — I872 Venous insufficiency (chronic) (peripheral): Secondary | ICD-10-CM | POA: Diagnosis not present

## 2021-10-28 DIAGNOSIS — L97212 Non-pressure chronic ulcer of right calf with fat layer exposed: Secondary | ICD-10-CM | POA: Diagnosis not present

## 2021-10-28 DIAGNOSIS — L97812 Non-pressure chronic ulcer of other part of right lower leg with fat layer exposed: Secondary | ICD-10-CM | POA: Diagnosis not present

## 2021-10-28 DIAGNOSIS — N183 Chronic kidney disease, stage 3 unspecified: Secondary | ICD-10-CM | POA: Diagnosis not present

## 2021-10-28 DIAGNOSIS — I129 Hypertensive chronic kidney disease with stage 1 through stage 4 chronic kidney disease, or unspecified chronic kidney disease: Secondary | ICD-10-CM | POA: Diagnosis not present

## 2021-11-04 DIAGNOSIS — I83012 Varicose veins of right lower extremity with ulcer of calf: Secondary | ICD-10-CM | POA: Diagnosis not present

## 2021-11-04 DIAGNOSIS — L97812 Non-pressure chronic ulcer of other part of right lower leg with fat layer exposed: Secondary | ICD-10-CM | POA: Diagnosis not present

## 2021-11-04 DIAGNOSIS — Z872 Personal history of diseases of the skin and subcutaneous tissue: Secondary | ICD-10-CM | POA: Diagnosis not present

## 2021-11-04 DIAGNOSIS — I872 Venous insufficiency (chronic) (peripheral): Secondary | ICD-10-CM | POA: Diagnosis not present

## 2021-11-04 DIAGNOSIS — L97212 Non-pressure chronic ulcer of right calf with fat layer exposed: Secondary | ICD-10-CM | POA: Diagnosis not present

## 2021-11-07 DIAGNOSIS — R399 Unspecified symptoms and signs involving the genitourinary system: Secondary | ICD-10-CM | POA: Diagnosis not present

## 2021-11-08 DIAGNOSIS — N1831 Chronic kidney disease, stage 3a: Secondary | ICD-10-CM | POA: Diagnosis not present

## 2021-11-08 DIAGNOSIS — R4189 Other symptoms and signs involving cognitive functions and awareness: Secondary | ICD-10-CM | POA: Diagnosis not present

## 2021-11-08 DIAGNOSIS — R946 Abnormal results of thyroid function studies: Secondary | ICD-10-CM | POA: Diagnosis not present

## 2021-11-08 DIAGNOSIS — E785 Hyperlipidemia, unspecified: Secondary | ICD-10-CM | POA: Diagnosis not present

## 2021-11-08 DIAGNOSIS — I129 Hypertensive chronic kidney disease with stage 1 through stage 4 chronic kidney disease, or unspecified chronic kidney disease: Secondary | ICD-10-CM | POA: Diagnosis not present

## 2021-11-14 DIAGNOSIS — R4189 Other symptoms and signs involving cognitive functions and awareness: Secondary | ICD-10-CM | POA: Diagnosis not present

## 2021-11-14 DIAGNOSIS — R202 Paresthesia of skin: Secondary | ICD-10-CM | POA: Diagnosis not present

## 2021-11-19 DIAGNOSIS — E785 Hyperlipidemia, unspecified: Secondary | ICD-10-CM | POA: Diagnosis not present

## 2021-11-19 DIAGNOSIS — G3184 Mild cognitive impairment, so stated: Secondary | ICD-10-CM | POA: Diagnosis not present

## 2021-11-19 DIAGNOSIS — R4189 Other symptoms and signs involving cognitive functions and awareness: Secondary | ICD-10-CM | POA: Diagnosis not present

## 2021-11-19 DIAGNOSIS — N1831 Chronic kidney disease, stage 3a: Secondary | ICD-10-CM | POA: Diagnosis not present

## 2021-11-19 DIAGNOSIS — R946 Abnormal results of thyroid function studies: Secondary | ICD-10-CM | POA: Diagnosis not present

## 2021-11-19 DIAGNOSIS — R269 Unspecified abnormalities of gait and mobility: Secondary | ICD-10-CM | POA: Diagnosis not present

## 2021-11-19 DIAGNOSIS — I129 Hypertensive chronic kidney disease with stage 1 through stage 4 chronic kidney disease, or unspecified chronic kidney disease: Secondary | ICD-10-CM | POA: Diagnosis not present

## 2021-11-19 DIAGNOSIS — F419 Anxiety disorder, unspecified: Secondary | ICD-10-CM | POA: Diagnosis not present

## 2021-11-21 DIAGNOSIS — N1831 Chronic kidney disease, stage 3a: Secondary | ICD-10-CM | POA: Diagnosis not present

## 2021-11-21 DIAGNOSIS — I129 Hypertensive chronic kidney disease with stage 1 through stage 4 chronic kidney disease, or unspecified chronic kidney disease: Secondary | ICD-10-CM | POA: Diagnosis not present

## 2021-11-21 DIAGNOSIS — E785 Hyperlipidemia, unspecified: Secondary | ICD-10-CM | POA: Diagnosis not present

## 2021-11-21 DIAGNOSIS — F419 Anxiety disorder, unspecified: Secondary | ICD-10-CM | POA: Diagnosis not present

## 2021-11-21 DIAGNOSIS — Z9181 History of falling: Secondary | ICD-10-CM | POA: Diagnosis not present

## 2021-11-21 DIAGNOSIS — G3184 Mild cognitive impairment, so stated: Secondary | ICD-10-CM | POA: Diagnosis not present

## 2021-11-21 DIAGNOSIS — I8390 Asymptomatic varicose veins of unspecified lower extremity: Secondary | ICD-10-CM | POA: Diagnosis not present

## 2021-11-21 DIAGNOSIS — M419 Scoliosis, unspecified: Secondary | ICD-10-CM | POA: Diagnosis not present

## 2021-11-26 DIAGNOSIS — N1831 Chronic kidney disease, stage 3a: Secondary | ICD-10-CM | POA: Diagnosis not present

## 2021-11-26 DIAGNOSIS — I129 Hypertensive chronic kidney disease with stage 1 through stage 4 chronic kidney disease, or unspecified chronic kidney disease: Secondary | ICD-10-CM | POA: Diagnosis not present

## 2021-11-26 DIAGNOSIS — G3184 Mild cognitive impairment, so stated: Secondary | ICD-10-CM | POA: Diagnosis not present

## 2021-11-26 DIAGNOSIS — I8390 Asymptomatic varicose veins of unspecified lower extremity: Secondary | ICD-10-CM | POA: Diagnosis not present

## 2021-11-26 DIAGNOSIS — F419 Anxiety disorder, unspecified: Secondary | ICD-10-CM | POA: Diagnosis not present

## 2021-11-26 DIAGNOSIS — M419 Scoliosis, unspecified: Secondary | ICD-10-CM | POA: Diagnosis not present

## 2021-11-27 DIAGNOSIS — F419 Anxiety disorder, unspecified: Secondary | ICD-10-CM | POA: Diagnosis not present

## 2021-11-27 DIAGNOSIS — G3184 Mild cognitive impairment, so stated: Secondary | ICD-10-CM | POA: Diagnosis not present

## 2021-11-27 DIAGNOSIS — M419 Scoliosis, unspecified: Secondary | ICD-10-CM | POA: Diagnosis not present

## 2021-11-27 DIAGNOSIS — I8390 Asymptomatic varicose veins of unspecified lower extremity: Secondary | ICD-10-CM | POA: Diagnosis not present

## 2021-11-27 DIAGNOSIS — I129 Hypertensive chronic kidney disease with stage 1 through stage 4 chronic kidney disease, or unspecified chronic kidney disease: Secondary | ICD-10-CM | POA: Diagnosis not present

## 2021-11-27 DIAGNOSIS — N1831 Chronic kidney disease, stage 3a: Secondary | ICD-10-CM | POA: Diagnosis not present

## 2021-11-28 DIAGNOSIS — M419 Scoliosis, unspecified: Secondary | ICD-10-CM | POA: Diagnosis not present

## 2021-11-28 DIAGNOSIS — G3184 Mild cognitive impairment, so stated: Secondary | ICD-10-CM | POA: Diagnosis not present

## 2021-11-28 DIAGNOSIS — I129 Hypertensive chronic kidney disease with stage 1 through stage 4 chronic kidney disease, or unspecified chronic kidney disease: Secondary | ICD-10-CM | POA: Diagnosis not present

## 2021-11-28 DIAGNOSIS — F419 Anxiety disorder, unspecified: Secondary | ICD-10-CM | POA: Diagnosis not present

## 2021-11-28 DIAGNOSIS — I8390 Asymptomatic varicose veins of unspecified lower extremity: Secondary | ICD-10-CM | POA: Diagnosis not present

## 2021-11-28 DIAGNOSIS — N1831 Chronic kidney disease, stage 3a: Secondary | ICD-10-CM | POA: Diagnosis not present

## 2021-12-03 DIAGNOSIS — N1831 Chronic kidney disease, stage 3a: Secondary | ICD-10-CM | POA: Diagnosis not present

## 2021-12-03 DIAGNOSIS — G3184 Mild cognitive impairment, so stated: Secondary | ICD-10-CM | POA: Diagnosis not present

## 2021-12-03 DIAGNOSIS — M419 Scoliosis, unspecified: Secondary | ICD-10-CM | POA: Diagnosis not present

## 2021-12-03 DIAGNOSIS — I8390 Asymptomatic varicose veins of unspecified lower extremity: Secondary | ICD-10-CM | POA: Diagnosis not present

## 2021-12-03 DIAGNOSIS — F419 Anxiety disorder, unspecified: Secondary | ICD-10-CM | POA: Diagnosis not present

## 2021-12-03 DIAGNOSIS — I129 Hypertensive chronic kidney disease with stage 1 through stage 4 chronic kidney disease, or unspecified chronic kidney disease: Secondary | ICD-10-CM | POA: Diagnosis not present

## 2021-12-04 DIAGNOSIS — N1831 Chronic kidney disease, stage 3a: Secondary | ICD-10-CM | POA: Diagnosis not present

## 2021-12-04 DIAGNOSIS — F419 Anxiety disorder, unspecified: Secondary | ICD-10-CM | POA: Diagnosis not present

## 2021-12-04 DIAGNOSIS — I8390 Asymptomatic varicose veins of unspecified lower extremity: Secondary | ICD-10-CM | POA: Diagnosis not present

## 2021-12-04 DIAGNOSIS — I129 Hypertensive chronic kidney disease with stage 1 through stage 4 chronic kidney disease, or unspecified chronic kidney disease: Secondary | ICD-10-CM | POA: Diagnosis not present

## 2021-12-04 DIAGNOSIS — M419 Scoliosis, unspecified: Secondary | ICD-10-CM | POA: Diagnosis not present

## 2021-12-04 DIAGNOSIS — G3184 Mild cognitive impairment, so stated: Secondary | ICD-10-CM | POA: Diagnosis not present

## 2021-12-05 ENCOUNTER — Ambulatory Visit (INDEPENDENT_AMBULATORY_CARE_PROVIDER_SITE_OTHER): Payer: Medicare Other | Admitting: Podiatry

## 2021-12-05 DIAGNOSIS — B351 Tinea unguium: Secondary | ICD-10-CM | POA: Diagnosis not present

## 2021-12-05 DIAGNOSIS — M2042 Other hammer toe(s) (acquired), left foot: Secondary | ICD-10-CM | POA: Diagnosis not present

## 2021-12-05 DIAGNOSIS — M2041 Other hammer toe(s) (acquired), right foot: Secondary | ICD-10-CM

## 2021-12-05 DIAGNOSIS — L600 Ingrowing nail: Secondary | ICD-10-CM | POA: Diagnosis not present

## 2021-12-07 NOTE — Progress Notes (Signed)
Subjective:   Patient ID: [name], female   DOB: 88 y.o.   MRN: [id number]   HPI 88 year old female presents the office today for concerns of ingrown toenails possibly to her big toenails which are about 2 weeks ago she had some soreness the nails.  No swelling redness or injury to the toenail sites.  Also she states that the left first and second toes to rub causing discomfort she tries to keep her padding between the toes.  No open lesions that she reports.  No swelling or redness or any drainage.  No injuries recently.   Review of Systems  All other systems reviewed and are negative.  Past Medical History:  Diagnosis Date   Hypercholesterolemia    Controlled by diet   Hypertension    Controlled by medications   Palpitations    Hx of   Scoliosis    Hx of    Past Surgical History:  Procedure Laterality Date   US ECHOCARDIOGRAPHY  11-10-2005   EF 55-60%     Current Outpatient Medications:    aspirin 81 MG tablet, Take 81 mg by mouth daily.  , Disp: , Rfl:    metoprolol tartrate (LOPRESSOR) 25 MG tablet, Take 1 tablet (25 mg total) by mouth 2 (two) times daily., Disp: 180 tablet, Rfl: 3   Thiamine HCl (VITAMIN B-1) 50 MG tablet, Take 50 mg by mouth daily.  , Disp: , Rfl:   Allergies  Allergen Reactions   Codeine Nausea And Vomiting   Dipth, Acell Pertus, [Tetanus-Diphth-Acell Pertussis]     Allergic to diptheria   Lipitor [Atorvastatin Calcium]     Fatigue   Procaine Hcl     unknown          Objective:  Physical Exam  General: AAO x3, NAD  Dermatological: Hallux is a mildly incurvated only nail borders and mildly hypertrophic, dystrophic with discoloration.  No edema, erythema or signs of infection.  There is no open lesions.  Vascular: Dorsalis Pedis artery and Posterior Tibial artery pedal pulses are 2/4 bilateral with immedate capillary fill time. There is no pain with calf compression, swelling, warmth, erythema.   Neruologic: Grossly intact via light  touch bilateral.   Musculoskeletal: Bunion, hammertoe present on the left foot which is causing rubbing between the first and second toes.  Muscular strength 5/5 in all groups tested bilateral.  Gait: Unassisted, Nonantalgic.       Assessment:   Ingrown toenails, digital deformity     Plan:  -Treatment options discussed including all alternatives, risks, and complications -Etiology of symptoms were discussed -Sharply debrided the nails with any complications or bleeding.  I was able to remove the symptomatic portion of the ingrown toenail.  No signs of infection will need to monitor for this.  If symptoms persist may need to proceed with partial nail avulsion. -Dispensed gel offloading pads, toe separators.  Vivi Barrack DPM

## 2021-12-09 DIAGNOSIS — I129 Hypertensive chronic kidney disease with stage 1 through stage 4 chronic kidney disease, or unspecified chronic kidney disease: Secondary | ICD-10-CM | POA: Diagnosis not present

## 2021-12-09 DIAGNOSIS — M419 Scoliosis, unspecified: Secondary | ICD-10-CM | POA: Diagnosis not present

## 2021-12-09 DIAGNOSIS — N1831 Chronic kidney disease, stage 3a: Secondary | ICD-10-CM | POA: Diagnosis not present

## 2021-12-09 DIAGNOSIS — F419 Anxiety disorder, unspecified: Secondary | ICD-10-CM | POA: Diagnosis not present

## 2021-12-09 DIAGNOSIS — I8390 Asymptomatic varicose veins of unspecified lower extremity: Secondary | ICD-10-CM | POA: Diagnosis not present

## 2021-12-09 DIAGNOSIS — G3184 Mild cognitive impairment, so stated: Secondary | ICD-10-CM | POA: Diagnosis not present

## 2021-12-17 DIAGNOSIS — R4189 Other symptoms and signs involving cognitive functions and awareness: Secondary | ICD-10-CM | POA: Diagnosis not present

## 2021-12-17 DIAGNOSIS — F419 Anxiety disorder, unspecified: Secondary | ICD-10-CM | POA: Diagnosis not present

## 2021-12-18 DIAGNOSIS — G3184 Mild cognitive impairment, so stated: Secondary | ICD-10-CM | POA: Diagnosis not present

## 2021-12-18 DIAGNOSIS — I129 Hypertensive chronic kidney disease with stage 1 through stage 4 chronic kidney disease, or unspecified chronic kidney disease: Secondary | ICD-10-CM | POA: Diagnosis not present

## 2021-12-18 DIAGNOSIS — N1831 Chronic kidney disease, stage 3a: Secondary | ICD-10-CM | POA: Diagnosis not present

## 2021-12-18 DIAGNOSIS — M419 Scoliosis, unspecified: Secondary | ICD-10-CM | POA: Diagnosis not present

## 2021-12-18 DIAGNOSIS — I8390 Asymptomatic varicose veins of unspecified lower extremity: Secondary | ICD-10-CM | POA: Diagnosis not present

## 2021-12-18 DIAGNOSIS — F419 Anxiety disorder, unspecified: Secondary | ICD-10-CM | POA: Diagnosis not present

## 2021-12-21 DIAGNOSIS — M419 Scoliosis, unspecified: Secondary | ICD-10-CM | POA: Diagnosis not present

## 2021-12-21 DIAGNOSIS — I129 Hypertensive chronic kidney disease with stage 1 through stage 4 chronic kidney disease, or unspecified chronic kidney disease: Secondary | ICD-10-CM | POA: Diagnosis not present

## 2021-12-21 DIAGNOSIS — N1831 Chronic kidney disease, stage 3a: Secondary | ICD-10-CM | POA: Diagnosis not present

## 2021-12-21 DIAGNOSIS — E785 Hyperlipidemia, unspecified: Secondary | ICD-10-CM | POA: Diagnosis not present

## 2021-12-21 DIAGNOSIS — F419 Anxiety disorder, unspecified: Secondary | ICD-10-CM | POA: Diagnosis not present

## 2021-12-21 DIAGNOSIS — Z9181 History of falling: Secondary | ICD-10-CM | POA: Diagnosis not present

## 2021-12-21 DIAGNOSIS — I8390 Asymptomatic varicose veins of unspecified lower extremity: Secondary | ICD-10-CM | POA: Diagnosis not present

## 2021-12-21 DIAGNOSIS — G3184 Mild cognitive impairment, so stated: Secondary | ICD-10-CM | POA: Diagnosis not present

## 2021-12-26 DIAGNOSIS — I8390 Asymptomatic varicose veins of unspecified lower extremity: Secondary | ICD-10-CM | POA: Diagnosis not present

## 2021-12-26 DIAGNOSIS — I129 Hypertensive chronic kidney disease with stage 1 through stage 4 chronic kidney disease, or unspecified chronic kidney disease: Secondary | ICD-10-CM | POA: Diagnosis not present

## 2021-12-26 DIAGNOSIS — M419 Scoliosis, unspecified: Secondary | ICD-10-CM | POA: Diagnosis not present

## 2021-12-26 DIAGNOSIS — N1831 Chronic kidney disease, stage 3a: Secondary | ICD-10-CM | POA: Diagnosis not present

## 2021-12-26 DIAGNOSIS — G3184 Mild cognitive impairment, so stated: Secondary | ICD-10-CM | POA: Diagnosis not present

## 2021-12-26 DIAGNOSIS — F419 Anxiety disorder, unspecified: Secondary | ICD-10-CM | POA: Diagnosis not present

## 2022-01-01 DIAGNOSIS — G3184 Mild cognitive impairment, so stated: Secondary | ICD-10-CM | POA: Diagnosis not present

## 2022-01-01 DIAGNOSIS — F419 Anxiety disorder, unspecified: Secondary | ICD-10-CM | POA: Diagnosis not present

## 2022-01-01 DIAGNOSIS — I8390 Asymptomatic varicose veins of unspecified lower extremity: Secondary | ICD-10-CM | POA: Diagnosis not present

## 2022-01-01 DIAGNOSIS — I129 Hypertensive chronic kidney disease with stage 1 through stage 4 chronic kidney disease, or unspecified chronic kidney disease: Secondary | ICD-10-CM | POA: Diagnosis not present

## 2022-01-01 DIAGNOSIS — M419 Scoliosis, unspecified: Secondary | ICD-10-CM | POA: Diagnosis not present

## 2022-01-01 DIAGNOSIS — N1831 Chronic kidney disease, stage 3a: Secondary | ICD-10-CM | POA: Diagnosis not present

## 2022-01-16 DIAGNOSIS — M419 Scoliosis, unspecified: Secondary | ICD-10-CM | POA: Diagnosis not present

## 2022-01-16 DIAGNOSIS — I129 Hypertensive chronic kidney disease with stage 1 through stage 4 chronic kidney disease, or unspecified chronic kidney disease: Secondary | ICD-10-CM | POA: Diagnosis not present

## 2022-01-16 DIAGNOSIS — N1831 Chronic kidney disease, stage 3a: Secondary | ICD-10-CM | POA: Diagnosis not present

## 2022-01-16 DIAGNOSIS — G3184 Mild cognitive impairment, so stated: Secondary | ICD-10-CM | POA: Diagnosis not present

## 2022-01-16 DIAGNOSIS — I8390 Asymptomatic varicose veins of unspecified lower extremity: Secondary | ICD-10-CM | POA: Diagnosis not present

## 2022-01-16 DIAGNOSIS — F419 Anxiety disorder, unspecified: Secondary | ICD-10-CM | POA: Diagnosis not present

## 2022-03-07 ENCOUNTER — Ambulatory Visit (INDEPENDENT_AMBULATORY_CARE_PROVIDER_SITE_OTHER): Payer: Medicare Other | Admitting: Podiatry

## 2022-03-07 DIAGNOSIS — B351 Tinea unguium: Secondary | ICD-10-CM

## 2022-03-07 DIAGNOSIS — M79675 Pain in left toe(s): Secondary | ICD-10-CM

## 2022-03-07 DIAGNOSIS — M79674 Pain in right toe(s): Secondary | ICD-10-CM | POA: Diagnosis not present

## 2022-03-07 NOTE — Progress Notes (Unsigned)
Subjective: 88 y.o. returns the office today for painful, elongated, thickened toenails which they cannot trim themself. Denies any redness or drainage around the nails. Denies any acute changes since last appointment and no new complaints today. Denies any systemic complaints such as fevers, chills, nausea, vomiting.   PCP: Alysia Penna, MD Last Seen:  A1c:  Objective: AAO 3, NAD DP/PT pulses palpable, CRT less than 3 seconds Protective sensation *** with Simms Weinstein monofilament, Achilles tendon reflex intact.  Nails hypertrophic, dystrophic, elongated, brittle, discolored ***. There is tenderness overlying the nails 1-5 bilaterally. There is no surrounding erythema or drainage along the nail sites. No open lesions or pre-ulcerative lesions are identified. No other areas of tenderness bilateral lower extremities. No overlying edema, erythema, increased warmth. No pain with calf compression, swelling, warmth, erythema.  Assessment: Patient presents with symptomatic onychomycosis  Plan: -Treatment options including alternatives, risks, complications were discussed -Nails sharply debrided *** without complication/bleeding. -Discussed daily foot inspection. If there are any changes, to call the office immediately.  -Follow-up in 3 months or sooner if any problems are to arise. In the meantime, encouraged to call the office with any questions, concerns, changes symptoms.  Ovid Curd, DPM

## 2022-03-26 DIAGNOSIS — H52203 Unspecified astigmatism, bilateral: Secondary | ICD-10-CM | POA: Diagnosis not present

## 2022-03-26 DIAGNOSIS — H2512 Age-related nuclear cataract, left eye: Secondary | ICD-10-CM | POA: Diagnosis not present

## 2022-03-26 DIAGNOSIS — H353112 Nonexudative age-related macular degeneration, right eye, intermediate dry stage: Secondary | ICD-10-CM | POA: Diagnosis not present

## 2022-03-26 DIAGNOSIS — H04123 Dry eye syndrome of bilateral lacrimal glands: Secondary | ICD-10-CM | POA: Diagnosis not present

## 2022-03-26 DIAGNOSIS — H25012 Cortical age-related cataract, left eye: Secondary | ICD-10-CM | POA: Diagnosis not present

## 2022-03-26 DIAGNOSIS — H524 Presbyopia: Secondary | ICD-10-CM | POA: Diagnosis not present

## 2022-04-16 DIAGNOSIS — Z23 Encounter for immunization: Secondary | ICD-10-CM | POA: Diagnosis not present

## 2022-04-16 DIAGNOSIS — L97919 Non-pressure chronic ulcer of unspecified part of right lower leg with unspecified severity: Secondary | ICD-10-CM | POA: Diagnosis not present

## 2022-04-16 DIAGNOSIS — S81801A Unspecified open wound, right lower leg, initial encounter: Secondary | ICD-10-CM | POA: Diagnosis not present

## 2022-04-16 DIAGNOSIS — I83893 Varicose veins of bilateral lower extremities with other complications: Secondary | ICD-10-CM | POA: Diagnosis not present

## 2022-04-23 DIAGNOSIS — N1832 Chronic kidney disease, stage 3b: Secondary | ICD-10-CM | POA: Diagnosis not present

## 2022-04-23 DIAGNOSIS — R7989 Other specified abnormal findings of blood chemistry: Secondary | ICD-10-CM | POA: Diagnosis not present

## 2022-04-23 DIAGNOSIS — I10 Essential (primary) hypertension: Secondary | ICD-10-CM | POA: Diagnosis not present

## 2022-04-23 DIAGNOSIS — E785 Hyperlipidemia, unspecified: Secondary | ICD-10-CM | POA: Diagnosis not present

## 2022-04-23 DIAGNOSIS — R946 Abnormal results of thyroid function studies: Secondary | ICD-10-CM | POA: Diagnosis not present

## 2022-04-23 DIAGNOSIS — M810 Age-related osteoporosis without current pathological fracture: Secondary | ICD-10-CM | POA: Diagnosis not present

## 2022-04-24 DIAGNOSIS — I83012 Varicose veins of right lower extremity with ulcer of calf: Secondary | ICD-10-CM | POA: Diagnosis not present

## 2022-04-24 DIAGNOSIS — I872 Venous insufficiency (chronic) (peripheral): Secondary | ICD-10-CM | POA: Diagnosis not present

## 2022-04-24 DIAGNOSIS — L97212 Non-pressure chronic ulcer of right calf with fat layer exposed: Secondary | ICD-10-CM | POA: Diagnosis not present

## 2022-04-24 DIAGNOSIS — L97812 Non-pressure chronic ulcer of other part of right lower leg with fat layer exposed: Secondary | ICD-10-CM | POA: Diagnosis not present

## 2022-04-30 DIAGNOSIS — Z1331 Encounter for screening for depression: Secondary | ICD-10-CM | POA: Diagnosis not present

## 2022-04-30 DIAGNOSIS — R82998 Other abnormal findings in urine: Secondary | ICD-10-CM | POA: Diagnosis not present

## 2022-04-30 DIAGNOSIS — D692 Other nonthrombocytopenic purpura: Secondary | ICD-10-CM | POA: Diagnosis not present

## 2022-04-30 DIAGNOSIS — L97919 Non-pressure chronic ulcer of unspecified part of right lower leg with unspecified severity: Secondary | ICD-10-CM | POA: Diagnosis not present

## 2022-04-30 DIAGNOSIS — I83893 Varicose veins of bilateral lower extremities with other complications: Secondary | ICD-10-CM | POA: Diagnosis not present

## 2022-04-30 DIAGNOSIS — E785 Hyperlipidemia, unspecified: Secondary | ICD-10-CM | POA: Diagnosis not present

## 2022-04-30 DIAGNOSIS — I10 Essential (primary) hypertension: Secondary | ICD-10-CM | POA: Diagnosis not present

## 2022-04-30 DIAGNOSIS — Z0000 Encounter for general adult medical examination without abnormal findings: Secondary | ICD-10-CM | POA: Diagnosis not present

## 2022-04-30 DIAGNOSIS — S81801A Unspecified open wound, right lower leg, initial encounter: Secondary | ICD-10-CM | POA: Diagnosis not present

## 2022-04-30 DIAGNOSIS — Z1339 Encounter for screening examination for other mental health and behavioral disorders: Secondary | ICD-10-CM | POA: Diagnosis not present

## 2022-04-30 DIAGNOSIS — I129 Hypertensive chronic kidney disease with stage 1 through stage 4 chronic kidney disease, or unspecified chronic kidney disease: Secondary | ICD-10-CM | POA: Diagnosis not present

## 2022-04-30 DIAGNOSIS — N1831 Chronic kidney disease, stage 3a: Secondary | ICD-10-CM | POA: Diagnosis not present

## 2022-05-01 DIAGNOSIS — L97812 Non-pressure chronic ulcer of other part of right lower leg with fat layer exposed: Secondary | ICD-10-CM | POA: Diagnosis not present

## 2022-05-01 DIAGNOSIS — I872 Venous insufficiency (chronic) (peripheral): Secondary | ICD-10-CM | POA: Diagnosis not present

## 2022-05-12 DIAGNOSIS — L97812 Non-pressure chronic ulcer of other part of right lower leg with fat layer exposed: Secondary | ICD-10-CM | POA: Diagnosis not present

## 2022-05-12 DIAGNOSIS — I872 Venous insufficiency (chronic) (peripheral): Secondary | ICD-10-CM | POA: Diagnosis not present

## 2022-05-16 DIAGNOSIS — R55 Syncope and collapse: Secondary | ICD-10-CM | POA: Diagnosis not present

## 2022-05-16 DIAGNOSIS — R001 Bradycardia, unspecified: Secondary | ICD-10-CM | POA: Diagnosis not present

## 2022-05-16 DIAGNOSIS — R0902 Hypoxemia: Secondary | ICD-10-CM | POA: Diagnosis not present

## 2022-05-23 DIAGNOSIS — 419620001 Death: Secondary | SNOMED CT | POA: Diagnosis not present

## 2022-05-23 DEATH — deceased

## 2022-06-11 ENCOUNTER — Ambulatory Visit: Payer: Medicare Other | Admitting: Podiatry
